# Patient Record
Sex: Female | Born: 1935 | Race: White | Hispanic: No | Smoking: Never smoker
Health system: Southern US, Community
[De-identification: ages and names within clinical notes are randomized; demographics above are authoritative.]

## PROBLEM LIST (undated history)

## (undated) DIAGNOSIS — I429 Cardiomyopathy, unspecified: Secondary | ICD-10-CM

## (undated) DIAGNOSIS — I4891 Unspecified atrial fibrillation: Secondary | ICD-10-CM

## (undated) DIAGNOSIS — I509 Heart failure, unspecified: Secondary | ICD-10-CM

## (undated) DIAGNOSIS — F0390 Unspecified dementia without behavioral disturbance: Secondary | ICD-10-CM

## (undated) HISTORY — DX: Heart failure, unspecified: I50.9

## (undated) HISTORY — DX: Unspecified atrial fibrillation: I48.91

## (undated) HISTORY — DX: Cardiomyopathy, unspecified: I42.9

## (undated) HISTORY — DX: Unspecified dementia, unspecified severity, without behavioral disturbance, psychotic disturbance, mood disturbance, and anxiety: F03.90

---

## 2015-08-16 DIAGNOSIS — H5213 Myopia, bilateral: Secondary | ICD-10-CM | POA: Diagnosis not present

## 2016-05-13 DIAGNOSIS — H02002 Unspecified entropion of right lower eyelid: Secondary | ICD-10-CM | POA: Diagnosis not present

## 2016-05-13 DIAGNOSIS — H02005 Unspecified entropion of left lower eyelid: Secondary | ICD-10-CM | POA: Diagnosis not present

## 2016-05-13 DIAGNOSIS — H01006 Unspecified blepharitis left eye, unspecified eyelid: Secondary | ICD-10-CM | POA: Diagnosis not present

## 2016-05-13 DIAGNOSIS — H2511 Age-related nuclear cataract, right eye: Secondary | ICD-10-CM | POA: Diagnosis not present

## 2016-05-13 DIAGNOSIS — H2512 Age-related nuclear cataract, left eye: Secondary | ICD-10-CM | POA: Diagnosis not present

## 2016-05-13 DIAGNOSIS — H01003 Unspecified blepharitis right eye, unspecified eyelid: Secondary | ICD-10-CM | POA: Diagnosis not present

## 2016-06-19 DIAGNOSIS — J069 Acute upper respiratory infection, unspecified: Secondary | ICD-10-CM | POA: Diagnosis not present

## 2016-07-17 DIAGNOSIS — B078 Other viral warts: Secondary | ICD-10-CM | POA: Diagnosis not present

## 2017-01-13 DIAGNOSIS — L218 Other seborrheic dermatitis: Secondary | ICD-10-CM | POA: Diagnosis not present

## 2017-03-03 DIAGNOSIS — L853 Xerosis cutis: Secondary | ICD-10-CM | POA: Diagnosis not present

## 2017-03-12 DIAGNOSIS — L218 Other seborrheic dermatitis: Secondary | ICD-10-CM | POA: Diagnosis not present

## 2017-06-24 DIAGNOSIS — R490 Dysphonia: Secondary | ICD-10-CM | POA: Diagnosis not present

## 2017-06-26 DIAGNOSIS — H2512 Age-related nuclear cataract, left eye: Secondary | ICD-10-CM | POA: Diagnosis not present

## 2017-06-26 DIAGNOSIS — H35033 Hypertensive retinopathy, bilateral: Secondary | ICD-10-CM | POA: Diagnosis not present

## 2017-06-26 DIAGNOSIS — H524 Presbyopia: Secondary | ICD-10-CM | POA: Diagnosis not present

## 2017-06-26 DIAGNOSIS — H5213 Myopia, bilateral: Secondary | ICD-10-CM | POA: Diagnosis not present

## 2017-06-26 DIAGNOSIS — H2511 Age-related nuclear cataract, right eye: Secondary | ICD-10-CM | POA: Diagnosis not present

## 2017-11-17 DIAGNOSIS — L570 Actinic keratosis: Secondary | ICD-10-CM | POA: Diagnosis not present

## 2017-11-17 DIAGNOSIS — L821 Other seborrheic keratosis: Secondary | ICD-10-CM | POA: Diagnosis not present

## 2017-11-17 DIAGNOSIS — L814 Other melanin hyperpigmentation: Secondary | ICD-10-CM | POA: Diagnosis not present

## 2020-06-08 DIAGNOSIS — R4189 Other symptoms and signs involving cognitive functions and awareness: Secondary | ICD-10-CM | POA: Diagnosis not present

## 2020-07-20 ENCOUNTER — Ambulatory Visit: Payer: Medicare Other | Admitting: Neurology

## 2020-07-20 ENCOUNTER — Encounter: Payer: Self-pay | Admitting: Neurology

## 2020-07-20 VITALS — BP 162/91 | HR 95 | Ht 65.0 in | Wt 101.0 lb

## 2020-07-20 DIAGNOSIS — R413 Other amnesia: Secondary | ICD-10-CM

## 2020-07-20 NOTE — Progress Notes (Signed)
Reason for visit: Dementia  Referring physician: Dr. Hipolito Bayley Kathleen is a 87 y.o. [name]  History of present illness:  Kathleen [name] is an 87 year old right-handed white [name] with a history of a progressive memory disorder.  Kathleen [name] has come in with her daughters today, one of her daughters now lives with her over Kathleen last 7 months.  They have noted a change in her memory over Kathleen last 1 to 2 years.  Kathleen [name] is no longer operating a motor vehicle.  Kathleen [name] requires assistance at this point with managing her medications and appointments.  She needs some help with cooking as well.  Kathleen [name] has developed a short-term memory issue, she has some word finding problems and some difficulty with repeating herself.  She has not had any hallucinations.  She otherwise is very healthy.  She sleeps well at night, she has no numbness or weakness of extremities, she has no balance issues.  She denies issues controlling Kathleen bowels or Kathleen bladder.  She denies any family history of memory issues.  Kathleen [name] has never had surgery and has never really been on any medications for any medical problem.  She is sent to this office for further evaluation.  Her primary care doctor has checked a thyroid profile and a B12 level, Kathleen results of these studies are not available to me.  Past Medical History:  Diagnosis Date  . Dementia (HCC)     History reviewed. No pertinent surgical history.  Family History  Problem Relation Age of Onset  . Stroke Sister   . Leukemia Brother     Social history:  reports that she has never smoked. She has never used smokeless tobacco. She reports that she does not drink alcohol and does not use drugs.  Medications:  Prior to Admission medications   Not on File     No Known Allergies  ROS:  Out of a complete 14 system review of symptoms, Kathleen [name] complains only of Kathleen following symptoms, and all other reviewed systems are negative.  Memory  problems  Blood pressure (!) 162/91, pulse 95, height 5\' 5"  (1.651 m), weight 101 lb (45.8 kg).  Physical Exam  General: Kathleen [name] is alert and cooperative at Kathleen time of Kathleen examination.  Eyes: Pupils are equal, round, and reactive to light. Discs are flat bilaterally.  Neck: Kathleen neck is supple, no carotid bruits are noted.  Respiratory: Kathleen respiratory examination is clear.  Cardiovascular: Kathleen cardiovascular examination reveals a regular rate and rhythm, no obvious murmurs or rubs are noted.  Skin: Extremities are without significant edema.  Neurologic Exam  Mental status: Kathleen [name] is alert and oriented x 3 at Kathleen time of Kathleen examination. Kathleen Mini-Mental status examination done today shows a total score 22/30.  Cranial nerves: Facial symmetry is present. There is good sensation of Kathleen face to pinprick and soft touch bilaterally. Kathleen strength of Kathleen facial muscles and Kathleen muscles to head turning and shoulder shrug are normal bilaterally. Speech is well enunciated, no aphasia or dysarthria is noted. Extraocular movements are full. Visual fields are full. Kathleen tongue is midline, and Kathleen [name] has symmetric elevation of Kathleen soft palate. No obvious hearing deficits are noted.  Motor: Kathleen motor testing reveals 5 over 5 strength of all 4 extremities. Good symmetric motor tone is noted throughout.  Sensory: Sensory testing is intact to pinprick, soft touch, vibration sensation, and position sense on all 4 extremities. No evidence of  extinction is noted.  Coordination: Cerebellar testing reveals good finger-nose-finger and heel-to-shin bilaterally.  Gait and station: Gait is normal. Tandem gait is slightly unsteady. Romberg is negative. No drift is seen.  Reflexes: Deep tendon reflexes are symmetric and normal bilaterally. Toes are downgoing bilaterally.   Assessment/Plan:  1.  Dementia, probable Alzheimer's disease  Kathleen [name] has been unusually healthy throughout her life.   Unfortunately, she has developed some troubles with memory over Kathleen last year or 2.  Her daughter is now living with her and managing her affairs.  We will get a CT scan of Kathleen brain, we discussed Kathleen potential for going on medication such as Aricept or Namenda.  Kathleen daughters wish to discuss this with her before making a decision.  Kathleen [name] will follow up through this office in 6 months.  Marlan Palau MD 07/20/2020 3:56 PM  [facility] [address] [place], [place] [address]  Phone [phone] Fax [phone]

## 2020-07-24 ENCOUNTER — Telehealth: Payer: Self-pay | Admitting: Neurology

## 2020-07-24 NOTE — Telephone Encounter (Signed)
[facility] medicare order sent to [facility]. No auth they will reach out to the patient to schedule.  

## 2020-08-14 ENCOUNTER — Ambulatory Visit: Payer: Medicare Other | Admitting: Neurology

## 2020-08-31 DIAGNOSIS — Z03818 Encounter for observation for suspected exposure to other biological agents ruled out: Secondary | ICD-10-CM | POA: Diagnosis not present

## 2020-08-31 DIAGNOSIS — R0602 Shortness of breath: Secondary | ICD-10-CM | POA: Diagnosis not present

## 2020-09-06 ENCOUNTER — Inpatient Hospital Stay (HOSPITAL_BASED_OUTPATIENT_CLINIC_OR_DEPARTMENT_OTHER)
Admission: EM | Admit: 2020-09-06 | Discharge: 2020-09-11 | DRG: 291 | Disposition: A | Discharge status: Home Health | Payer: Medicare Other | Attending: Internal Medicine | Admitting: Internal Medicine

## 2020-09-06 ENCOUNTER — Encounter (HOSPITAL_BASED_OUTPATIENT_CLINIC_OR_DEPARTMENT_OTHER): Payer: Self-pay | Admitting: *Deleted

## 2020-09-06 ENCOUNTER — Emergency Department (HOSPITAL_BASED_OUTPATIENT_CLINIC_OR_DEPARTMENT_OTHER): Payer: Medicare Other | Admitting: Radiology

## 2020-09-06 ENCOUNTER — Other Ambulatory Visit: Payer: Self-pay

## 2020-09-06 DIAGNOSIS — I10 Essential (primary) hypertension: Secondary | ICD-10-CM

## 2020-09-06 DIAGNOSIS — I5041 Acute combined systolic (congestive) and diastolic (congestive) heart failure: Secondary | ICD-10-CM | POA: Diagnosis present

## 2020-09-06 DIAGNOSIS — R0602 Shortness of breath: Secondary | ICD-10-CM | POA: Diagnosis not present

## 2020-09-06 DIAGNOSIS — I959 Hypotension, unspecified: Secondary | ICD-10-CM | POA: Diagnosis not present

## 2020-09-06 DIAGNOSIS — I509 Heart failure, unspecified: Secondary | ICD-10-CM | POA: Diagnosis not present

## 2020-09-06 DIAGNOSIS — M810 Age-related osteoporosis without current pathological fracture: Secondary | ICD-10-CM | POA: Diagnosis present

## 2020-09-06 DIAGNOSIS — J9601 Acute respiratory failure with hypoxia: Secondary | ICD-10-CM | POA: Diagnosis not present

## 2020-09-06 DIAGNOSIS — H9190 Unspecified hearing loss, unspecified ear: Secondary | ICD-10-CM | POA: Diagnosis present

## 2020-09-06 DIAGNOSIS — J90 Pleural effusion, not elsewhere classified: Secondary | ICD-10-CM | POA: Diagnosis not present

## 2020-09-06 DIAGNOSIS — F0390 Unspecified dementia without behavioral disturbance: Secondary | ICD-10-CM | POA: Diagnosis not present

## 2020-09-06 DIAGNOSIS — Z20822 Contact with and (suspected) exposure to covid-19: Secondary | ICD-10-CM | POA: Diagnosis not present

## 2020-09-06 DIAGNOSIS — R410 Disorientation, unspecified: Secondary | ICD-10-CM | POA: Diagnosis not present

## 2020-09-06 DIAGNOSIS — I429 Cardiomyopathy, unspecified: Secondary | ICD-10-CM | POA: Diagnosis present

## 2020-09-06 DIAGNOSIS — E43 Unspecified severe protein-calorie malnutrition: Secondary | ICD-10-CM | POA: Insufficient documentation

## 2020-09-06 DIAGNOSIS — Z66 Do not resuscitate: Secondary | ICD-10-CM | POA: Diagnosis not present

## 2020-09-06 DIAGNOSIS — I480 Paroxysmal atrial fibrillation: Secondary | ICD-10-CM | POA: Diagnosis present

## 2020-09-06 DIAGNOSIS — R0902 Hypoxemia: Secondary | ICD-10-CM | POA: Diagnosis not present

## 2020-09-06 DIAGNOSIS — I110 Hypertensive heart disease with heart failure: Secondary | ICD-10-CM | POA: Diagnosis not present

## 2020-09-06 DIAGNOSIS — Z681 Body mass index (BMI) 19 or less, adult: Secondary | ICD-10-CM

## 2020-09-06 DIAGNOSIS — I5021 Acute systolic (congestive) heart failure: Secondary | ICD-10-CM | POA: Diagnosis not present

## 2020-09-06 LAB — RESP PANEL BY RT-PCR (FLU A&B, COVID) ARPGX2
Influenza A by PCR: NEGATIVE
Influenza B by PCR: NEGATIVE
SARS Coronavirus 2 by RT PCR: NEGATIVE

## 2020-09-06 LAB — BRAIN NATRIURETIC PEPTIDE: B Natriuretic Peptide: 1461.6 pg/mL — ABNORMAL HIGH (ref 0.0–100.0)

## 2020-09-06 LAB — COMPREHENSIVE METABOLIC PANEL
ALT: 30 U/L (ref 0–44)
AST: 32 U/L (ref 15–41)
Albumin: 3.8 g/dL (ref 3.5–5.0)
Alkaline Phosphatase: 70 U/L (ref 38–126)
Anion gap: 8 (ref 5–15)
BUN: 17 mg/dL (ref 8–23)
CO2: 24 mmol/L (ref 22–32)
Calcium: 9.1 mg/dL (ref 8.9–10.3)
Chloride: 109 mmol/L (ref 98–111)
Creatinine, Ser: 0.92 mg/dL (ref 0.44–1.00)
GFR, Estimated: 60 mL/min — ABNORMAL LOW (ref >60)
Glucose, Bld: 94 mg/dL (ref 70–99)
Potassium: 4.3 mmol/L (ref 3.5–5.1)
Sodium: 141 mmol/L (ref 135–145)
Total Bilirubin: 0.5 mg/dL (ref 0.3–1.2)
Total Protein: 6 g/dL — ABNORMAL LOW (ref 6.5–8.1)

## 2020-09-06 LAB — CBC WITH DIFFERENTIAL/PLATELET
Abs Immature Granulocytes: 0.01 10*3/uL (ref 0.00–0.07)
Basophils Absolute: 0.1 10*3/uL (ref 0.0–0.1)
Basophils Relative: 1 %
Eosinophils Absolute: 0 10*3/uL (ref 0.0–0.5)
Eosinophils Relative: 1 %
HCT: 38.6 % (ref 36.0–46.0)
Hemoglobin: 12.3 g/dL (ref 12.0–15.0)
Immature Granulocytes: 0 %
Lymphocytes Relative: 22 %
Lymphs Abs: 1.2 10*3/uL (ref 0.7–4.0)
MCH: 32.4 pg (ref 26.0–34.0)
MCHC: 31.9 g/dL (ref 30.0–36.0)
MCV: 101.6 fL — ABNORMAL HIGH (ref 80.0–100.0)
Monocytes Absolute: 0.4 10*3/uL (ref 0.1–1.0)
Monocytes Relative: 7 %
Neutro Abs: 3.7 10*3/uL (ref 1.7–7.7)
Neutrophils Relative %: 69 %
Platelets: 160 10*3/uL (ref 150–400)
RBC: 3.8 MIL/uL — ABNORMAL LOW (ref 3.87–5.11)
RDW: 13.9 % (ref 11.5–15.5)
WBC: 5.4 10*3/uL (ref 4.0–10.5)
nRBC: 0 % (ref 0.0–0.2)

## 2020-09-06 LAB — TROPONIN I (HIGH SENSITIVITY)
Troponin I (High Sensitivity): 16 ng/L (ref <18)
Troponin I (High Sensitivity): 18 ng/L — ABNORMAL HIGH (ref <18)

## 2020-09-06 MED ORDER — ENOXAPARIN SODIUM 40 MG/0.4ML IJ SOSY
40.0000 mg | PREFILLED_SYRINGE | INTRAMUSCULAR | Status: DC
  Administered 2020-09-06: 40 mg via SUBCUTANEOUS
  Filled 2020-09-06: qty 0.4

## 2020-09-06 MED ORDER — FUROSEMIDE 10 MG/ML IJ SOLN
20.0000 mg | Freq: Once | INTRAMUSCULAR | Status: AC
  Administered 2020-09-06: 20 mg via INTRAVENOUS
  Filled 2020-09-06: qty 2

## 2020-09-06 NOTE — ED Triage Notes (Signed)
Shortness of breath x 6 days, patient stated that she is better today.  Denies fever

## 2020-09-06 NOTE — ED Provider Notes (Signed)
[facility] EMERGENCY DEPT Provider Note   CSN: [id number] Arrival date & time: 09/06/20  1251     History Chief Complaint  Patient presents with  . Shortness of Breath    Kathleen [name] is a 87 y.o. female.  87 year old female with history of dementia who presents with shortness of breath.  Patient has had 1 week of progressively worsening shortness of breath especially with exertion.  Daughter notes that for the past several nights she has not been able to lay flat and seems to get very dyspneic when she tries to do so.  Patient denies any complaints including no chest pain or abdominal pain.  She has had no cough, congestion, fevers, or other URI symptoms.  No leg swelling.  Daughter states patient has no cardiac history.  She does not take any medications.  LEVEL 5 CAVEAT DUE TO DEMENTIA  The history is provided by the patient and a relative.  Shortness of Breath      Past Medical History:  Diagnosis Date  . Dementia (HCC)     There are no problems to display for this patient.   History reviewed. No pertinent surgical history.   OB History    Gravida  2   Para  2   Term      Preterm      AB      Living        SAB      IAB      Ectopic      Multiple      Live Births              Family History  Problem Relation Age of Onset  . Stroke Sister   . Leukemia Brother     Social History   Tobacco Use  . Smoking status: Never Smoker  . Smokeless tobacco: Never Used  Substance Use Topics  . Alcohol use: Never  . Drug use: Never    Home Medications Prior to Admission medications   Not on File    Allergies    Patient has no known allergies.  Review of Systems   Review of Systems  Unable to perform ROS: Dementia  Respiratory: Positive for shortness of breath.     Physical Exam Updated Vital Signs BP (!) 142/92 Comment: 4L [place]  Pulse (!) 32 Comment: 4L [place]  Temp 98.5 F (36.9 C) (Oral)   Resp (!) 22 Comment: 4L [place]   Ht 5\' 4"  (1.626 m)   Wt 47.4 kg   SpO2 94% Comment: 4L [place]  BMI 17.92 kg/m   Physical Exam Vitals and nursing note reviewed.  Constitutional:      General: She is not in acute distress.    Appearance: Normal appearance.  HENT:     Head: Normocephalic and atraumatic.  Eyes:     Conjunctiva/sclera: Conjunctivae normal.  Cardiovascular:     Rate and Rhythm: Normal rate and regular rhythm.     Heart sounds: Murmur heard.    Pulmonary:     Comments: Tachypnea without respiratory distress, diminished bilaterally with crackles in bilateral bases Abdominal:     General: Abdomen is flat. Bowel sounds are normal. There is no distension.     Palpations: Abdomen is soft.     Tenderness: There is no abdominal tenderness.  Musculoskeletal:     Right lower leg: No edema.     Left lower leg: No edema.  Skin:    General: Skin is warm and dry.  Neurological:     Mental Status: She is alert.     Comments: Alert, following commands  Psychiatric:        Mood and Affect: Mood normal.        Behavior: Behavior normal.        Cognition and Memory: Memory is impaired.     ED Results / Procedures / Treatments   Labs (all labs ordered are listed, but only abnormal results are displayed) Labs Reviewed  COMPREHENSIVE METABOLIC PANEL - Abnormal; Notable for the following components:      Result Value   Total Protein 6.0 (*)    GFR, Estimated 60 (*)    All other components within normal limits  CBC WITH DIFFERENTIAL/PLATELET - Abnormal; Notable for the following components:   RBC 3.80 (*)    MCV 101.6 (*)    All other components within normal limits  BRAIN NATRIURETIC PEPTIDE - Abnormal; Notable for the following components:   B Natriuretic Peptide 1,461.6 (*)    All other components within normal limits  RESP PANEL BY RT-PCR (FLU A&B, COVID) [id number]  TROPONIN I (HIGH SENSITIVITY)  TROPONIN I (HIGH SENSITIVITY)    EKG EKG Interpretation  Date/Time:  Wednesday September 06 2020 12:58:44  EDT Ventricular Rate:  94 PR Interval:  123 QRS Duration: 84 QT Interval:  355 QTC Calculation: 444 R Axis:   56 Text Interpretation: Sinus tachycardia Paired ventricular premature complexes Nonspecific T abnormalities, lateral leads No previous ECGs available Confirmed by Frederick Peers [phone]) on 09/06/2020 1:01:24 PM   Radiology DG Chest 2 View  Result Date: 09/06/2020 CLINICAL DATA:  Shortness of breath EXAM: CHEST - 2 VIEW COMPARISON:  None. FINDINGS: There are pleural effusions bilaterally with patchy airspace opacity in the lower lung regions bilaterally. Heart size and pulmonary vascularity are normal. No adenopathy. There is aortic atherosclerosis. There is severe anterior wedging of the T12 vertebral body. There is an old healed fracture of the proximal left humerus. Bones are osteoporotic. IMPRESSION: Pleural effusions bilaterally. Suspect bibasilar pneumonia superimposed. Heart size and pulmonary vascularity are normal. Osteoporosis with prior fractures of the proximal left humerus and T12 vertebral body. Aortic Atherosclerosis (ICD10-I70.0). Electronically Signed   By: Bretta Bang III M.D.   On: 09/06/2020 14:12    Procedures Procedures   Medications Ordered in ED Medications  furosemide (LASIX) injection 20 mg (20 mg Intravenous Given 09/06/20 1444)    ED Course  I have reviewed the triage vital signs and the nursing notes.  Pertinent labs & imaging results that were available during my care of the patient were reviewed by me and considered in my medical decision making (see chart for details).    MDM Rules/Calculators/A&P                          Patient was nontoxic on exam, 89% on room air and required 2 L oxygen to maintain saturations.  Crackles in murmur noted.  Suspect congestive heart failure based on slow progression of symptoms and no URI symptoms to suggest a pneumonia.  Lab work notable for BNP 1461, negative troponin.  Negative COVID.  X-ray with bilateral  pleural effusions.  Patient has no risk factors for PE.  Gave 20 mg IV Lasix and recommended admission for diuresis and heart failure work-up, daughter in agreement.  Discussed admission with [facility] hospitalist, Dr. Denton Lank.  Final Clinical Impression(s) / ED Diagnoses Final diagnoses:  Acute congestive heart failure, unspecified heart failure type (  HCC)  Acute respiratory failure with hypoxia [facility])    Rx / DC Orders ED Discharge Orders    None       Dominyk Law, Ambrose Finland, MD 09/06/20 1502

## 2020-09-06 NOTE — ED Notes (Signed)
Daughter has gone to get pt food.

## 2020-09-06 NOTE — ED Notes (Signed)
Patient has a bed at [facility] [id number]--[facility] to transport patient

## 2020-09-06 NOTE — ED Notes (Signed)
[facility] at bedside 

## 2020-09-07 ENCOUNTER — Inpatient Hospital Stay (HOSPITAL_COMMUNITY): Payer: Medicare Other

## 2020-09-07 DIAGNOSIS — I509 Heart failure, unspecified: Secondary | ICD-10-CM

## 2020-09-07 DIAGNOSIS — I10 Essential (primary) hypertension: Secondary | ICD-10-CM

## 2020-09-07 DIAGNOSIS — J9601 Acute respiratory failure with hypoxia: Secondary | ICD-10-CM

## 2020-09-07 LAB — ECHOCARDIOGRAM COMPLETE
Area-P 1/2: 4.15 cm2
Calc EF: 30.8 %
Height: 64 in
S' Lateral: 3.8 cm
Single Plane A2C EF: 20.4 %
Single Plane A4C EF: 39 %
Weight: 1670.4 oz

## 2020-09-07 LAB — BASIC METABOLIC PANEL
Anion gap: 7 (ref 5–15)
BUN: 16 mg/dL (ref 8–23)
CO2: 28 mmol/L (ref 22–32)
Calcium: 8.8 mg/dL — ABNORMAL LOW (ref 8.9–10.3)
Chloride: 108 mmol/L (ref 98–111)
Creatinine, Ser: 0.95 mg/dL (ref 0.44–1.00)
GFR, Estimated: 58 mL/min — ABNORMAL LOW (ref >60)
Glucose, Bld: 92 mg/dL (ref 70–99)
Potassium: 3.8 mmol/L (ref 3.5–5.1)
Sodium: 143 mmol/L (ref 135–145)

## 2020-09-07 MED ORDER — ONDANSETRON HCL 4 MG PO TABS: 4.0000 mg | ORAL_TABLET | Freq: Four times a day (QID) | ORAL | Status: DC | PRN

## 2020-09-07 MED ORDER — FUROSEMIDE 10 MG/ML IJ SOLN: 20.0000 mg | Freq: Every day | INTRAMUSCULAR | Status: DC

## 2020-09-07 MED ORDER — ACETAMINOPHEN 650 MG RE SUPP: 650.0000 mg | Freq: Four times a day (QID) | RECTAL | Status: DC | PRN

## 2020-09-07 MED ORDER — LISINOPRIL 5 MG PO TABS: 5.0000 mg | ORAL_TABLET | Freq: Every day | ORAL | Status: DC

## 2020-09-07 MED ORDER — POTASSIUM CHLORIDE CRYS ER 20 MEQ PO TBCR
40.0000 meq | EXTENDED_RELEASE_TABLET | Freq: Once | ORAL | Status: AC
  Administered 2020-09-07: 40 meq via ORAL
  Filled 2020-09-07: qty 2

## 2020-09-07 MED ORDER — DILTIAZEM HCL 25 MG/5ML IV SOLN
10.0000 mg | Freq: Once | INTRAVENOUS | Status: AC
  Administered 2020-09-07: 10 mg via INTRAVENOUS
  Filled 2020-09-07: qty 5

## 2020-09-07 MED ORDER — ONDANSETRON HCL 4 MG/2ML IJ SOLN: 4.0000 mg | Freq: Four times a day (QID) | INTRAMUSCULAR | Status: DC | PRN

## 2020-09-07 MED ORDER — ENOXAPARIN SODIUM 60 MG/0.6ML IJ SOSY
50.0000 mg | PREFILLED_SYRINGE | Freq: Every day | INTRAMUSCULAR | Status: DC
  Administered 2020-09-07 – 2020-09-08 (×2): 50 mg via SUBCUTANEOUS
  Filled 2020-09-07 (×2): qty 0.6

## 2020-09-07 MED ORDER — SENNOSIDES-DOCUSATE SODIUM 8.6-50 MG PO TABS: 1.0000 | ORAL_TABLET | Freq: Every evening | ORAL | Status: DC | PRN

## 2020-09-07 MED ORDER — FUROSEMIDE 10 MG/ML IJ SOLN
40.0000 mg | Freq: Every day | INTRAMUSCULAR | Status: DC
  Administered 2020-09-07: 40 mg via INTRAVENOUS
  Filled 2020-09-07 (×2): qty 4

## 2020-09-07 MED ORDER — DILTIAZEM HCL-DEXTROSE 125-5 MG/125ML-% IV SOLN (PREMIX)
5.0000 mg/h | INTRAVENOUS | Status: DC
  Administered 2020-09-07: 5 mg/h via INTRAVENOUS
  Filled 2020-09-07: qty 125

## 2020-09-07 MED ORDER — ACETAMINOPHEN 325 MG PO TABS: 650.0000 mg | ORAL_TABLET | Freq: Four times a day (QID) | ORAL | Status: DC | PRN

## 2020-09-07 NOTE — Progress Notes (Signed)
   09/07/20 1048  Assess: MEWS Score  Temp 97.8 F (36.6 C)  BP (!) 105/55  ECG Heart Rate (!) 135  Resp 18  Level of Consciousness Alert  SpO2 95 %  O2 Device Nasal Cannula  O2 Flow Rate (L/min) 4 L/min  Assess: MEWS Score  MEWS Temp 0  MEWS Systolic 0  MEWS Pulse 3  MEWS RR 0  MEWS LOC 0  MEWS Score 3  MEWS Score Color Yellow  Assess: if the MEWS score is Yellow or Red  Were vital signs taken at a resting state? Yes  Focused Assessment No change from prior assessment  Does the patient meet 2 or more of the SIRS criteria? No  MEWS guidelines implemented *See Row Information* Yes  Treat  MEWS Interventions Administered scheduled meds/treatments  Notify: Charge Nurse/RN  Name of Charge Nurse/RN Notified Megan, RN  Date Charge Nurse/RN Notified 09/07/20  Time Charge Nurse/RN Notified 1103  Notify: Provider  Provider Name/Title Leodis Binet MD  Date Provider Notified 09/07/20  Time Provider Notified 1010  Notification Type Page  Notification Reason Change in status  Provider response See new orders  Date of Provider Response 09/07/20  Time of Provider Response 1020  Assess: SIRS CRITERIA  SIRS Temperature  0  SIRS Pulse 1  SIRS Respirations  0  SIRS WBC 0  SIRS Score Sum  1

## 2020-09-07 NOTE — Progress Notes (Signed)
  Echocardiogram 2D Echocardiogram has been performed.  Kathleen [name] 09/07/2020, 3:44 PM

## 2020-09-07 NOTE — Plan of Care (Signed)
?  Problem: Education: ?Goal: Ability to demonstrate management of disease process will improve ?Outcome: Progressing ?  ?Problem: Activity: ?Goal: Capacity to carry out activities will improve ?Outcome: Progressing ?  ?Problem: Cardiac: ?Goal: Ability to achieve and maintain adequate cardiopulmonary perfusion will improve ?Outcome: Progressing ?  ?

## 2020-09-07 NOTE — Progress Notes (Signed)
[facility] Progress Note  Patient: Kathleen [name]    DDU:[id number]  DOA: 09/06/2020     Date of Service: the patient was seen and examined on 09/07/2020  Brief hospital course: Past medical history of mild cognitive imbalance.  Presents with complaints of shortness of breath, orthopnea.  Work-up so far consistent with acute CHF.  Found to have A. fib with RVR during the hospital stay. Currently plan is rate control and treatment of CHF.  Assessment and Plan: 1.  Acute unspecified CHF, likely diastolic Likely combination of uncontrolled hypertension as well as A. fib with RVR. Echocardiogram completed currently results pending Given IV Lasix. Clinical examination appears to be euvolemic therefore we will discontinue IV Lasix and transition to p.o. tomorrow. Further work-up and treatment based on the results of the echocardiogram. Less likely ACS given negative troponin as well as no anginal symptoms.  2.  Acute hypoxic respiratory failure, POA Currently able to come off of the oxygen. Monitor.  3.  Paroxysmal A. fib with RVR Patient was given 1 dose of IV Cardizem without any improvement. Currently on Cardizem drip. For therapeutic anticoagulation discussed with patient and daughter in detail.  Patient does not have any active recurrent fall or bleeding history. [place] Vascor is 5. Family currently agreeable for therapy anticoagulation.  Will initiate with Lovenox and discuss DOAC's once echocardiogram is available.  4.  Essential hypertension Currently on Cardizem.  Holding lisinopril.  5.  Underweight Placing the patient at high risk of poor outcome. Monitor. Body mass index is 17.92 kg/m.   Diet: Cardiac diet DVT Prophylaxis: Subcutaneous Lovenox      Advance goals of care discussion: Full code  Family Communication: family was present at bedside, at the time of interview.  The pt provided permission to discuss medical plan with the family. Opportunity was given to  ask question and all questions were answered satisfactorily.   Disposition:  Status is: Inpatient  Remains inpatient appropriate because:Ongoing diagnostic testing needed not appropriate for outpatient work up   Dispo: The patient is from: Home              Anticipated d/c is to: Home              Patient currently is not medically stable to d/c.   Difficult to place patient No        Subjective: No nausea or vomiting.  No fever no chills.  Continues to have shortness of breath although feeling better.  No chest pain or chest tightness.  Physical Exam:  General: Appear in mild distress, no Rash; Oral Mucosa Clear, moist. no Abnormal Neck Mass Or lumps, Conjunctiva normal  Cardiovascular: S1 and S2 Present, no Murmur, Respiratory: good respiratory effort, Bilateral Air entry present and CTA, no Crackles, no wheezes Abdomen: Bowel Sound present, Soft and no tenderness Extremities: no Pedal edema Neurology: alert and oriented to time, place, and person affect appropriate. no new focal deficit Gait not checked due to patient safety concerns    Vitals:   09/07/20 0935 09/07/20 1004 09/07/20 1048 09/07/20 1330  BP: (!) 142/83 125/85 (!) 105/55 91/66  Pulse:    94  Resp: 20  18 18   Temp: 97.8 F (36.6 C)  97.8 F (36.6 C) (!) 97 F (36.1 C)  TempSrc: Axillary  Axillary Oral  SpO2: 95%  95%   Weight:      Height:        Intake/Output Summary (Last 24 hours) at 09/07/2020 1605 Last data  filed at 09/07/2020 1541 Gross per 24 hour  Intake 256.17 ml  Output 1550 ml  Net -1293.83 ml   Filed Weights   09/06/20 1259  Weight: 47.4 kg    Data Reviewed: I have personally reviewed and interpreted daily labs, tele strips, imaging. I reviewed all nursing notes, pharmacy notes, vitals, pertinent old records I have discussed plan of care as described above with RN and patient/family.  CBC: Recent Labs  Lab 09/06/20 1326  WBC 5.4  NEUTROABS 3.7  HGB 12.3  HCT 38.6  MCV  101.6*  PLT 160   Basic Metabolic Panel: Recent Labs  Lab 09/06/20 1326 09/07/20 0330  NA 141 143  K 4.3 3.8  CL 109 108  CO2 24 28  GLUCOSE 94 92  BUN 17 16  CREATININE 0.92 0.95  CALCIUM 9.1 8.8*    Studies: No results found.  Scheduled Meds: . enoxaparin (LOVENOX) injection  50 mg Subcutaneous Daily   Continuous Infusions: . diltiazem (CARDIZEM) infusion 5 mg/hr (09/07/20 1223)   PRN Meds: acetaminophen **OR** acetaminophen, ondansetron **OR** ondansetron (ZOFRAN) IV, senna-docusate  Time spent: 35 minutes  Author: Lynden Oxford, MD [facility] 09/07/2020 4:05 PM  To reach On-call, see care teams to locate the attending and reach out via [email or web address]. Between 7PM-7AM, please contact night-coverage If you still have difficulty reaching the attending provider, please page the [facility] (Director on Call) for [facility] on amion for assistance.

## 2020-09-07 NOTE — Plan of Care (Signed)
?  Problem: Education: ?Goal: Ability to demonstrate management of disease process will improve ?Outcome: Progressing ?  ?

## 2020-09-07 NOTE — H&P (Addendum)
History and Physical    [name]:[id number] DOB: 22-Jul-1929 DOA: 09/06/2020  PCP: Jarrett Soho, PA-C  Patient coming from: [facility]  I have personally briefly reviewed patient's old medical records in [facility]  Chief Complaint: acute hypoxia  HPI: Kathleen [name] is a 87 y.o. female with medical history significant for cognitive impairment, hearing impairment who presents with concerns of new onset CHF exacerbation.   Daughter at bedside provides most of history since patient unable to recall her symptoms.  About a week ago patient began to complain of increasing shortness of breath with laying flat.  The symptoms would improve with sleeping with more pillows and she eventually had to sleep on the recliner.  Denies any chest pain or palpitation symptoms. No fever or cough.  No lower extremity edema.  Denies any tobacco, alcohol or illicit drug use. Daughter has tried to take blood pressure at home but does not think it has been actual since it fluctuates from systolic of 160 to 130s at times.   ED Course: She was noted to be mildly hypertensive with systolic in the 160s requiring 2 L of O2, BNP of 1461, troponin of 16 and then 18.  EKG shows frequent PVCs but no ST or T wave abnormalities.  Chest x-ray shows bilateral pleural effusion.  CBC and CMP unremarkable.  She received 20 mg of IV Lasix in the ED with over 1 L of urine output.  Review of Systems:  Constitutional: No Weight Change, No Fever ENT/Mouth: No sore throat, No Rhinorrhea Eyes: No Eye Pain, No Vision Changes Cardiovascular: No Chest Pain, + SOB, No PND, + Dyspnea on Exertion, + Orthopnea, No Claudication, No Edema, No Palpitations Respiratory: No Cough, No Sputum, No Wheezing, no Dyspnea  Gastrointestinal: No Nausea, No Vomiting, No Diarrhea,Genitourinary: no Urinary Incontinence, No Urgency, No Flank Pain Musculoskeletal: No Arthralgias, No Myalgias Skin: No Skin Lesions, No Pruritus, Neuro: no Weakness,  No Numbness Psych: No Anxiety/Panic, No Depression, no decrease appetite Heme/Lymph: No Bruising, No Bleeding   Past Medical History:  Diagnosis Date  . Dementia (HCC)     History reviewed. No pertinent surgical history.   reports that she has never smoked. She has never used smokeless tobacco. She reports that she does not drink alcohol and does not use drugs. Social History  No Known Allergies  Family History  Problem Relation Age of Onset  . Stroke Sister   . Leukemia Brother      Prior to Admission medications   Not on File    Physical Exam: Vitals:   09/06/20 1830 09/06/20 1946 09/06/20 2030 09/06/20 2200  BP: 133/78 (!) 142/82 (!) 144/68 (!) 160/78  Pulse: 87 86 (!) 45 84  Resp: (!) 28 (!) 27 (!) 26 (!) 29  Temp:    98.5 F (36.9 C)  TempSrc:    Oral  SpO2: 95% 97% 97% 93%  Weight:      Height:        Constitutional: NAD, calm, comfortable,thin elderly female laying at 30 degree incline in bed. Wears hearing aid.  Vitals:   09/06/20 1830 09/06/20 1946 09/06/20 2030 09/06/20 2200  BP: 133/78 (!) 142/82 (!) 144/68 (!) 160/78  Pulse: 87 86 (!) 45 84  Resp: (!) 28 (!) 27 (!) 26 (!) 29  Temp:    98.5 F (36.9 C)  TempSrc:    Oral  SpO2: 95% 97% 97% 93%  Weight:      Height:  Eyes: PERRL, lids and conjunctivae normal ENMT: Mucous membranes are moist.  Neck: normal, supple Respiratory: clear to auscultation bilaterally, no wheezing, no crackles. Normal respiratory effort. No accessory muscle use.  Cardiovascular: Regular rate and rhythm, no murmurs / rubs / gallops. No extremity edema.   Abdomen: no tenderness, no masses palpated.  Bowel sounds positive.  Musculoskeletal: no clubbing / cyanosis. No joint deformity upper and lower extremities. Good ROM, no contractures. Normal muscle tone.  Skin: no rashes, lesions, ulcers. No induration Neurologic: CN 2-12 grossly intact. Sensation intact,  Strength 5/5 in all 4.  Psychiatric: Normal judgment and  insight. Alert and oriented x 3 but unable to recall symptoms. Normal and pleasant mood.    Labs on Admission: I have personally reviewed following labs and imaging studies  CBC: Recent Labs  Lab 09/06/20 1326  WBC 5.4  NEUTROABS 3.7  HGB 12.3  HCT 38.6  MCV 101.6*  PLT 160   Basic Metabolic Panel: Recent Labs  Lab 09/06/20 1326  NA 141  K 4.3  CL 109  CO2 24  GLUCOSE 94  BUN 17  CREATININE 0.92  CALCIUM 9.1   GFR: Estimated Creatinine Clearance: 31.6 mL/min (by C-G formula based on SCr of 0.92 mg/dL). Liver Function Tests: Recent Labs  Lab 09/06/20 1326  AST 32  ALT 30  ALKPHOS 70  BILITOT 0.5  PROT 6.0*  ALBUMIN 3.8   No results for input(s): LIPASE, AMYLASE in the last 168 hours. No results for input(s): AMMONIA in the last 168 hours. Coagulation Profile: No results for input(s): INR, PROTIME in the last 168 hours. Cardiac Enzymes: No results for input(s): CKTOTAL, CKMB, CKMBINDEX, TROPONINI in the last 168 hours. BNP (last 3 results) No results for input(s): PROBNP in the last 8760 hours. HbA1C: No results for input(s): HGBA1C in the last 72 hours. CBG: No results for input(s): GLUCAP in the last 168 hours. Lipid Profile: No results for input(s): CHOL, HDL, LDLCALC, TRIG, CHOLHDL, LDLDIRECT in the last 72 hours. Thyroid Function Tests: No results for input(s): TSH, T4TOTAL, FREET4, T3FREE, THYROIDAB in the last 72 hours. Anemia Panel: No results for input(s): VITAMINB12, FOLATE, FERRITIN, TIBC, IRON, RETICCTPCT in the last 72 hours. Urine analysis: No results found for: COLORURINE, APPEARANCEUR, LABSPEC, PHURINE, GLUCOSEU, HGBUR, BILIRUBINUR, KETONESUR, PROTEINUR, UROBILINOGEN, NITRITE, LEUKOCYTESUR  Radiological Exams on Admission: DG Chest 2 View  Result Date: 09/06/2020 CLINICAL DATA:  Shortness of breath EXAM: CHEST - 2 VIEW COMPARISON:  None. FINDINGS: There are pleural effusions bilaterally with patchy airspace opacity in the lower lung  regions bilaterally. Heart size and pulmonary vascularity are normal. No adenopathy. There is aortic atherosclerosis. There is severe anterior wedging of the T12 vertebral body. There is an old healed fracture of the proximal left humerus. Bones are osteoporotic. IMPRESSION: Pleural effusions bilaterally. Suspect bibasilar pneumonia superimposed. Heart size and pulmonary vascularity are normal. Osteoporosis with prior fractures of the proximal left humerus and T12 vertebral body. Aortic Atherosclerosis (ICD10-I70.0). Electronically Signed   By: Bretta Bang III M.D.   On: 09/06/2020 14:12      Assessment/Plan  New onset CHF exacerbation -possibly due to uncontrolled HTN  -Obtain echo -IV lasix 20mg  daily -strict intake and output -daily weights -start Lisinopril 5mg  daily  Acute hypoxic respiratory failure secondary to CHF exacerbation -Admitted on 2L, Maintain O2 >92%-wean as tolerated -treatment as above  HTN -start ACE as above    DVT prophylaxis:.Lovenox Code Status: Full Family Communication: Plan discussed with patient and daughter at bedside  disposition Plan: Home with at least 2 midnight stays  Consults called:  Admission status: inpatient  Level of care: Telemetry  Status is: Inpatient  Remains inpatient appropriate because:Inpatient level of care appropriate due to severity of illness   Dispo: The patient is from: Home              Anticipated d/c is to: Home              Patient currently is not medically stable to d/c.   Difficult to place patient No         Anselm Jungling DO [facility]   If 7PM-7AM, please contact night-coverage [email or web address]   09/07/2020, 12:28 AM

## 2020-09-08 LAB — BASIC METABOLIC PANEL
Anion gap: 6 (ref 5–15)
BUN: 23 mg/dL (ref 8–23)
CO2: 30 mmol/L (ref 22–32)
Calcium: 9.5 mg/dL (ref 8.9–10.3)
Chloride: 106 mmol/L (ref 98–111)
Creatinine, Ser: 0.96 mg/dL (ref 0.44–1.00)
GFR, Estimated: 57 mL/min — ABNORMAL LOW (ref >60)
Glucose, Bld: 128 mg/dL — ABNORMAL HIGH (ref 70–99)
Potassium: 4.1 mmol/L (ref 3.5–5.1)
Sodium: 142 mmol/L (ref 135–145)

## 2020-09-08 LAB — MAGNESIUM: Magnesium: 2 mg/dL (ref 1.7–2.4)

## 2020-09-08 MED ORDER — METOPROLOL TARTRATE 25 MG PO TABS
25.0000 mg | ORAL_TABLET | Freq: Two times a day (BID) | ORAL | Status: DC
  Administered 2020-09-08 – 2020-09-09 (×4): 25 mg via ORAL
  Filled 2020-09-08 (×4): qty 1

## 2020-09-08 MED ORDER — METOPROLOL TARTRATE 5 MG/5ML IV SOLN: 2.5000 mg | INTRAVENOUS | Status: DC | PRN

## 2020-09-08 MED ORDER — APIXABAN 2.5 MG PO TABS
2.5000 mg | ORAL_TABLET | Freq: Two times a day (BID) | ORAL | Status: DC
  Administered 2020-09-09 – 2020-09-11 (×5): 2.5 mg via ORAL
  Filled 2020-09-08 (×5): qty 1

## 2020-09-08 MED ORDER — SACUBITRIL-VALSARTAN 24-26 MG PO TABS
1.0000 | ORAL_TABLET | Freq: Two times a day (BID) | ORAL | Status: DC
  Administered 2020-09-08 (×2): 1 via ORAL
  Filled 2020-09-08 (×3): qty 1

## 2020-09-08 MED ORDER — FUROSEMIDE 20 MG PO TABS
20.0000 mg | ORAL_TABLET | Freq: Every day | ORAL | Status: DC
  Administered 2020-09-08: 20 mg via ORAL
  Filled 2020-09-08: qty 1

## 2020-09-08 NOTE — Care Management Important Message (Signed)
Important Message  Patient Details IM Letter given to the Patient. Name: Veora Fonte MRN: [id number] Date of Birth: Feb 21, 1930   Medicare Important Message Given:  Yes     Caren Macadam 09/08/2020, 10:32 AM

## 2020-09-08 NOTE — Plan of Care (Signed)
?  Problem: Education: ?Goal: Ability to demonstrate management of disease process will improve ?Outcome: Progressing ?  ?Problem: Activity: ?Goal: Capacity to carry out activities will improve ?Outcome: Progressing ?  ?Problem: Cardiac: ?Goal: Ability to achieve and maintain adequate cardiopulmonary perfusion will improve ?Outcome: Progressing ?  ?

## 2020-09-08 NOTE — Evaluation (Signed)
Physical Therapy Evaluation Only Patient Details Name: Kathleen [name] MRN: [id number] DOB: 03-03-1930 Today's Date: 09/08/2020   History of Present Illness  Kathleen [name] is an 87 yo female who presents with SOB. Chest xray: bil pleural effusions. PMH: dementia  Clinical Impression  Pt completes bed mobility, transfers and ambulation without DME, no VCs and no physical assist. Pt ambulates 360 ft in hallway on RA, SpO2 varying 88-92%, cued for pursed lip breathing when desats with good recovery. Pt without LOB with sudden stop, direction changes, speed changes and turns. Once returned to room, returned 2L O2 and RN notified of SpO2 with mobility. Pt denies chest pain, SOB, pain with ambulation. Pt with daughter providing supv at baseline due to cognition, good family support. No acute PT needs identified, will sign off at this time.    Follow Up Recommendations No PT follow up    Equipment Recommendations  None recommended by PT    Recommendations for Other Services       Precautions / Restrictions Precautions Precautions: Other (comment) Precaution Comments: monitor O2 Restrictions Weight Bearing Restrictions: No      Mobility  Bed Mobility Overal bed mobility: Modified Independent   Transfers Overall transfer level: Independent Equipment used: None    General transfer comment: powers to stand with good steadiness, no AD  Ambulation/Gait Ambulation/Gait assistance: Independent Gait Distance (Feet): 360 Feet Assistive device: None Gait Pattern/deviations: WFL(Within Functional Limits) Gait velocity: WFL   General Gait Details: pt ambulates without AD, completes turns, speed changes, direction changes, sudden stop, head turns without LOB  Stairs            Wheelchair Mobility    Modified Rankin (Stroke Patients Only)       Balance Overall balance assessment: No apparent balance deficits (not formally assessed)        Pertinent Vitals/Pain Pain Assessment:  No/denies pain    Home Living Family/patient expects to be discharged to:: Private residence Living Arrangements: Children Available Help at Discharge: Family;Available 24 hours/day Type of Home: House Home Access: Level entry     Home Layout: One level Home Equipment: Walker - 2 wheels;Cane - single point      Prior Function Level of Independence: Needs assistance   Gait / Transfers Assistance Needed: daughter reports pt requires supv for safety, no physical assistance, denies falls, walks on road for exercise and used to participate with exercise videos  ADL's / Homemaking Assistance Needed: daugther reports pt requires supv for safety, no physical assistance  Comments: daughter provides transportation     Hand Dominance        Extremity/Trunk Assessment   Upper Extremity Assessment Upper Extremity Assessment: Overall WFL for tasks assessed    Lower Extremity Assessment Lower Extremity Assessment: Overall WFL for tasks assessed (AROM WNL, strength 4+/5 throughout, denies numbness/tingling)    Cervical / Trunk Assessment Cervical / Trunk Assessment: Normal  Communication   Communication: No difficulties  Cognition Arousal/Alertness: Awake/alert Behavior During Therapy: WFL for tasks assessed/performed Overall Cognitive Status: History of cognitive impairments - at baseline  General Comments: Pt pleasant, follows commands appropriately, answers questions appropriately      General Comments General comments (skin integrity, edema, etc.): Pt on RA with SpO2 88-92% on RA, no dyspnea    Exercises     Assessment/Plan    PT Assessment Patent does not need any further PT services  PT Problem List         PT Treatment Interventions      PT  Goals (Current goals can be found in the Care Plan section)  Acute Rehab PT Goals Patient Stated Goal: back to exercising PT Goal Formulation: All assessment and education complete, DC therapy    Frequency     Barriers  to discharge        Co-evaluation               AM-PAC PT "6 Clicks" Mobility  Outcome Measure Help needed turning from your back to your side while in a flat bed without using bedrails?: None Help needed moving from lying on your back to sitting on the side of a flat bed without using bedrails?: None Help needed moving to and from a bed to a chair (including a wheelchair)?: None Help needed standing up from a chair using your arms (e.g., wheelchair or bedside chair)?: None Help needed to walk in hospital room?: None Help needed climbing 3-5 steps with a railing? : None 6 Click Score: 24    End of Session   Activity Tolerance: Patient tolerated treatment well Patient left: in chair;with call bell/phone within reach;with family/visitor present Nurse Communication: Mobility status;Other (comment) (SpO2) PT Visit Diagnosis: Other abnormalities of gait and mobility (R26.89)    Time: 6160-7371 PT Time Calculation (min) (ACUTE ONLY): 30 min   Charges:   PT Evaluation $PT Eval Low Complexity: 1 Low PT Treatments $Gait Training: 8-22 mins         Tori Sheryll Dymek PT, DPT 09/08/20, 11:48 AM

## 2020-09-08 NOTE — Discharge Instructions (Signed)

## 2020-09-08 NOTE — TOC Progression Note (Signed)
Transition of Care [facility]) - Progression Note    Patient Details  Name: Kathleen [name] MRN: [id number] Date of Birth: [birth year]  [facility] CM/SW Contact  Darleene Cleaver, [place] Phone Number: 09/08/2020, 5:48 PM  Clinical Narrative:     Patient is from home with her daughter.  CSW spoke to patient's daughter due to history of dementia.  Patient has not had home health before, CSW explained how insurance will pay for home health.  Patient's daughter stated she would like HH with PT, RN.  Csw.  CSW asked if daughter had any preference she did not.    CSW spoke to [place] [facility] they can accept [facility] services.  CSW asked if they needed any equipment, and per daughter April, they do not.  CSW to continue to follow patient's progress throughout discharge planning.                                                       Expected Discharge Plan: Home w Home Health Services Barriers to Discharge: Continued Medical Work up  Expected Discharge Plan and Services Expected Discharge Plan: Home w Home Health Services In-house Referral: Clinical Social [facility] Living arrangements for the past 2 months: Single Family Home                           HH Arranged: PT,Nurse's Aide,RN,Social Work [employer] Agency: [facility] ([facility]) Date HH Agency Contacted: 09/08/20 Time HH Agency Contacted: 1630 Representative spoke with at [facility] Agency: Thea Silversmith   Social Determinants of Health (SDOH) Interventions    Readmission Risk Interventions No flowsheet data found.

## 2020-09-08 NOTE — Consult Note (Addendum)
Cardiology Consultation:   Patient ID: Kathleen [name] MRN: [id number]; DOB: 1930-01-11  Admit date: 09/06/2020 Date of Consult: 09/08/2020  PCP:  Jarrett Soho, PA-C   [facility] Providers Cardiologist:  New to Dr. Antoine Poche   Patient Profile:   Kathleen [name] is a 87 y.o. female with a hx of dementia, who is being seen 09/08/2020 for the evaluation of CHF at the request of Dr. Allena Katz.   History of Present Illness:   Ms. Kathleen [name] has no known cardiac condition and follows no cardiologist previously.   She has been relatively healthy for her whole life until now. She was evaluated by neurology on 07/20/20 for 2 years duration of memory loss. CT brain is pending. She was concerned for new onset of Alzheimer's dementia.   She presented to ER 09/06/20 for SOB that is progressively worsening for 1 week. Daughter is at bedside offering history. She reports patient has not been able to lay flat due to significant dyspnea for the past couple nights. She has been requiring more pillows support at night. She noted that patient is easily winded with simple activities. This is unusual for the patient. Patient does not take any medication nor sees physician historically, she is unaware of any medical conditions patient had. She had not complained any chest pain, dizziness, leg swelling at home. She eats well with frequent snack throughout the day. She ambulates with steady gait and hardly had any falls at home. She is able to communicate simple needs, but has moderate memory loss. Daughter felt patient's breathing is improved after receiving lasix at ED. She had urinated a lot. She states patient had no productive cough, fever at home.   Diagnostic workup here showed unremarkable CBC diff, CMP. Hs Trop 16 >18. BNP 1461. Flu and COVID negative. CXR showed Pleural effusions bilaterally. Suspect bibasilar pneumonia superimposed. Heart size and pulmonary vascularity are normal. Osteoporosis with prior fractures of  the proximal left humerus and T12 vertebral body. Initial EKG showed sinus rhythm, rate of 94 bpm, PVCs, no previous EKG available to compare. She was afebrile, mildly hypertensive with BP 148/83 at ED. She was admitted to hospital medicine, concerned for new onset of CHF, given IV Lasix  total.   Echo completed 09/07/20 with EF 35-40%, LV moderately decreased function, LV global hypokinesis, mild concertric LVH, grade I DD, elevated LVEDP, RV systolic function mildly reduced, normal PASP, RVSP 32.4 mmHg. Mild MR.   She was  found to be in new onset of A fib with RVR 150s on 09/07/20 morning EKG. She was given IV dilatiazem bolus and started on metoprolol for rate control, and started on anticoagulation with therapeutic dose  Lovenox. Cardiology is consulted for further input.    Past Medical History:  Diagnosis Date  . Dementia (HCC)     History reviewed. No pertinent surgical history.   Home Medications:  Prior to Admission medications   Not on File    Inpatient Medications: Scheduled Meds: . [START ON 09/09/2020] apixaban  2.5 mg Oral BID  . metoprolol tartrate  25 mg Oral BID  . sacubitril-valsartan  1 tablet Oral BID   Continuous Infusions:  PRN Meds: acetaminophen **OR** acetaminophen, metoprolol tartrate, ondansetron **OR** ondansetron (ZOFRAN) IV, senna-docusate  Allergies:   No Known Allergies  Social History:   Social History   Socioeconomic History  . Marital status: Widowed    Spouse name: Not on file  . Number of children: Not on file  . Years of education: Not on file  .  Highest education level: Not on file  Occupational History  . Not on file  Tobacco Use  . Smoking status: Never Smoker  . Smokeless tobacco: Never Used  Substance and Sexual Activity  . Alcohol use: Never  . Drug use: Never  . Sexual activity: Not on file  Other Topics Concern  . Not on file  Social History Narrative   Lives with Daughter April   Right Handed   Drinks 1/2 - 1 liter  of Dr. Reino [employer]   Social Determinants of Health   Financial Resource Strain: Not on file  Food Insecurity: Not on file  Transportation Needs: Not on file  Physical Activity: Not on file  Stress: Not on file  Social Connections: Not on file  Intimate Partner Violence: Not on file    Family History:    Family History  Problem Relation Age of Onset  . Stroke Sister   . Leukemia Brother      ROS:  Constitutional: Denied fever, chills Eyes: Denied vision change or loss Ears/Nose/Mouth/Throat: Denied ear ache, sore throat, coughing, sinus pain Cardiovascular: see HPI  Respiratory: see HPI  Gastrointestinal: Denied nausea, vomiting, abdominal pain, diarrhea Genital/Urinary: Denied dysuria, hematuria, urinary frequency/urgency Musculoskeletal: Denied muscle ache, joint pain, weakness Skin: Denied rash, wound Neuro: Memory loss  Psych: Denied history of depression/anxiety  Endocrine: Denied history of diabetes   Physical Exam/Data:   Vitals:   09/08/20 0400 09/08/20 0500 09/08/20 0540 09/08/20 0600  BP:   (!) 126/57   Pulse:   78   Resp: (!) 24 (!) 26  (!) 24  Temp:   97.9 F (36.6 C)   TempSrc:   Oral   SpO2:   95%   Weight:  44.5 kg    Height:        Intake/Output Summary (Last 24 hours) at 09/08/2020 0929 Last data filed at 09/08/2020 0841 Gross per 24 hour  Intake 341.17 ml  Output 700 ml  Net -358.83 ml   Last 3 Weights 09/08/2020 09/06/2020 07/20/2020  Weight (lbs) 98 lb 1.7 oz 104 lb 6.4 oz 101 lb  Weight (kg) 44.5 kg 47.356 kg 45.813 kg     Body mass index is 16.84 kg/m.   Vitals:  Vitals:   09/08/20 0540 09/08/20 0600  BP: (!) 126/57   Pulse: 78   Resp:  (!) 24  Temp: 97.9 F (36.6 C)   SpO2: 95%    General Appearance: In no apparent distress, laying in bed HEENT: Normocephalic, atraumatic. EOMs intact. Sclera anicteric. Oropharynx is clear, tongue midline. Dentition is as expected for age. Neck: Supple, trachea midline, no JVDs, no carotid  bruits. Cardiovascular: Regular rate and rhythm, normal S1-S2,  no murmur/rub/gallop, S3/S4 Respiratory: Resting breathing unlabored, lungs sounds clear to auscultation bilaterally, no use of accessory muscles. On room air.  No wheezes, rales or rhonchi.   Gastrointestinal: Bowel sounds positive, abdomen soft, non-tender, non-distended. No mass or organomegaly.  Extremities: Able to move all extremities in bed without difficulty, no edema/cyanosis/clubbing, DP/PT pulses were 2+ and equal bilaterally Genitourinary: No CVA tenderness / genital exam not performed Musculoskeletal: Normal muscle bulk and tone, muscle strength 5/5 throughout, no limited range of motion, no swollen or erythematous joints Skin: Intact, warm, dry. No rashes or petechiae noted in exposed areas.  Neurologic: Alert, oriented to person, place and time. Fluent speech, no facial droop, no cognitive deficit, no pronator drift, no gross sensory deficit, no focal muscle weakness, no gross focal neuro deficit Psychiatric: Normal  affect. Mood is appropriate.    EKG:  The EKG was personally reviewed and demonstrates: 09/06/20 EKG showed sinus rhythm, rate of 94 bpm, occasional PVCs.  Repeat EKG 09/07/20 with A fib RVR 150s, LVH. No previous EKGs available for comparison.   Telemetry:  Telemetry was personally reviewed and demonstrates:  Sinus rhythm with rate of 70-80s, frequent PVCs, ventricular  bigeminy noted    Relevant CV Studies:   Echo from 09/07/20:  1. Left ventricular ejection fraction, by estimation, is 35 to 40%. The  left ventricle has moderately decreased function. The left ventricle  demonstrates global hypokinesis. There is mild concentric left ventricular  hypertrophy. Left ventricular  diastolic parameters are consistent with Grade I diastolic dysfunction  (impaired relaxation). Elevated left ventricular end-diastolic pressure.  2. Right ventricular systolic function is mildly reduced. The right  ventricular  size is normal. There is normal pulmonary artery systolic  pressure. The estimated right ventricular systolic pressure is 32.4 mmHg.  3. The mitral valve is normal in structure. Mild mitral valve  regurgitation. No evidence of mitral stenosis.  4. The aortic valve is normal in structure. Aortic valve regurgitation is  not visualized. No aortic stenosis is present.  5. The inferior vena cava is normal in size with greater than 50%  respiratory variability, suggesting right atrial pressure of 3 mmHg.    Laboratory Data:  High Sensitivity Troponin:   Recent Labs  Lab 09/06/20 1326 09/06/20 1628  TROPONINIHS 16 18*     Chemistry Recent Labs  Lab 09/06/20 1326 09/07/20 0330 09/08/20 0818  NA 141 143 142  K 4.3 3.8 4.1  CL 109 108 106  CO2 24 28 30   GLUCOSE 94 92 128*  BUN 17 16 23   CREATININE 0.92 0.95 0.96  CALCIUM 9.1 8.8* 9.5  GFRNONAA 60* 58* 57*  ANIONGAP 8 7 6     Recent Labs  Lab 09/06/20 1326  PROT 6.0*  ALBUMIN 3.8  AST 32  ALT 30  ALKPHOS 70  BILITOT 0.5   Hematology Recent Labs  Lab 09/06/20 1326  WBC 5.4  RBC 3.80*  HGB 12.3  HCT 38.6  MCV 101.6*  MCH 32.4  MCHC 31.9  RDW 13.9  PLT 160   BNP Recent Labs  Lab 09/06/20 1326  BNP 1,461.6*    DDimer No results for input(s): DDIMER in the last 168 hours.   Radiology/Studies:  DG Chest 2 View  Result Date: 09/06/2020 CLINICAL DATA:  Shortness of breath EXAM: CHEST - 2 VIEW COMPARISON:  None. FINDINGS: There are pleural effusions bilaterally with patchy airspace opacity in the lower lung regions bilaterally. Heart size and pulmonary vascularity are normal. No adenopathy. There is aortic atherosclerosis. There is severe anterior wedging of the T12 vertebral body. There is an old healed fracture of the proximal left humerus. Bones are osteoporotic. IMPRESSION: Pleural effusions bilaterally. Suspect bibasilar pneumonia superimposed. Heart size and pulmonary vascularity are normal. Osteoporosis  with prior fractures of the proximal left humerus and T12 vertebral body. Aortic Atherosclerosis (ICD10-I70.0). Electronically Signed   By: 11/06/20 III M.D.   On: 09/06/2020 14:12   ECHOCARDIOGRAM COMPLETE  Result Date: 09/07/2020    ECHOCARDIOGRAM REPORT   Patient Name:   VERDELL KINCANNON Date of Exam: 09/07/2020 Medical Rec #:  11/07/2020    Height:       64.0 in Accession #:    Roxan Diesel   Weight:       104.4 lb Date of Birth:  1929-11-13  BSA:          1.484 m Patient Age:    89 years     BP:           91/66 mmHg Patient Gender: F            HR:           86 bpm. Exam Location:  Inpatient Procedure: 2D Echo, 3D Echo, Cardiac Doppler and Color Doppler Indications:    I50.40* Unspecified combined systolic (congestive) and diastolic                 (congestive) heart failure  History:        Patient has no prior history of Echocardiogram examinations.                 CHF, Signs/Symptoms:Dyspnea, Shortness of Breath, Altered Mental                 Status and Alzheimer's; Risk Factors:Hypertension. Hypoxia.  Sonographer:    Sheralyn Boatman RDCS Referring Phys: [id number] CHING T TU  Sonographer Comments: Technically difficult study due to poor echo windows. IMPRESSIONS  1. Left ventricular ejection fraction, by estimation, is 35 to 40%. The left ventricle has moderately decreased function. The left ventricle demonstrates global hypokinesis. There is mild concentric left ventricular hypertrophy. Left ventricular diastolic parameters are consistent with Grade I diastolic dysfunction (impaired relaxation). Elevated left ventricular end-diastolic pressure.  2. Right ventricular systolic function is mildly reduced. The right ventricular size is normal. There is normal pulmonary artery systolic pressure. The estimated right ventricular systolic pressure is 32.4 mmHg.  3. The mitral valve is normal in structure. Mild mitral valve regurgitation. No evidence of mitral stenosis.  4. The aortic valve is normal in structure.  Aortic valve regurgitation is not visualized. No aortic stenosis is present.  5. The inferior vena cava is normal in size with greater than 50% respiratory variability, suggesting right atrial pressure of 3 mmHg. FINDINGS  Left Ventricle: Left ventricular ejection fraction, by estimation, is 35 to 40%. The left ventricle has moderately decreased function. The left ventricle demonstrates global hypokinesis. The left ventricular internal cavity size was normal in size. There is mild concentric left ventricular hypertrophy. Left ventricular diastolic parameters are consistent with Grade I diastolic dysfunction (impaired relaxation). Elevated left ventricular end-diastolic pressure. Right Ventricle: The right ventricular size is normal. No increase in right ventricular wall thickness. Right ventricular systolic function is mildly reduced. There is normal pulmonary artery systolic pressure. The tricuspid regurgitant velocity is 2.71 m/s, and with an assumed right atrial pressure of 3 mmHg, the estimated right ventricular systolic pressure is 32.4 mmHg. Left Atrium: Left atrial size was normal in size. Right Atrium: Right atrial size was normal in size. Pericardium: There is no evidence of pericardial effusion. Mitral Valve: The mitral valve is normal in structure. Mild mitral valve regurgitation. No evidence of mitral valve stenosis. Tricuspid Valve: The tricuspid valve is normal in structure. Tricuspid valve regurgitation is mild . No evidence of tricuspid stenosis. Aortic Valve: The aortic valve is normal in structure. Aortic valve regurgitation is not visualized. No aortic stenosis is present. Pulmonic Valve: The pulmonic valve was normal in structure. Pulmonic valve regurgitation is not visualized. No evidence of pulmonic stenosis. Aorta: The aortic root is normal in size and structure. Venous: The inferior vena cava is normal in size with greater than 50% respiratory variability, suggesting right atrial pressure of  3 mmHg. IAS/Shunts: No atrial level shunt detected by  color flow Doppler.  LEFT VENTRICLE PLAX 2D LVIDd:         4.40 cm     Diastology LVIDs:         3.80 cm     LV e' medial:    2.68 cm/s LV PW:         1.20 cm     LV E/e' medial:  25.5 LV IVS:        1.10 cm     LV e' lateral:   3.99 cm/s LVOT diam:     1.90 cm     LV E/e' lateral: 17.1 LV SV:         53 LV SV Index:   36 LVOT Area:     2.84 cm  LV Volumes (MOD) LV vol d, MOD A2C: 57.3 ml LV vol d, MOD A4C: 58.0 ml LV vol s, MOD A2C: 45.6 ml LV vol s, MOD A4C: 35.4 ml LV SV MOD A2C:     11.7 ml LV SV MOD A4C:     58.0 ml LV SV MOD BP:      18.8 ml RIGHT VENTRICLE            IVC RV S prime:     8.81 cm/s  IVC diam: 1.70 cm TAPSE (M-mode): 1.6 cm LEFT ATRIUM             Index       RIGHT ATRIUM           Index LA diam:        3.95 cm 2.66 cm/m  RA Area:     11.70 cm LA Vol (A2C):   54.6 ml 36.79 ml/m RA Volume:   25.50 ml  17.18 ml/m LA Vol (A4C):   33.8 ml 22.77 ml/m LA Biplane Vol: 46.7 ml 31.47 ml/m  AORTIC VALVE LVOT Vmax:   103.00 cm/s LVOT Vmean:  66.300 cm/s LVOT VTI:    0.186 m  AORTA Ao Root diam: 2.90 cm MITRAL VALVE                TRICUSPID VALVE MV Area (PHT): 4.15 cm     TR Peak grad:   29.4 mmHg MV Decel Time: 183 msec     TR Vmax:        271.00 cm/s MV E velocity: 68.40 cm/s MV A velocity: 105.00 cm/s  SHUNTS MV E/A ratio:  0.65         Systemic VTI:  0.19 m                             Systemic Diam: 1.90 cm Armanda Magic MD Electronically signed by Armanda Magic MD Signature Date/Time: 09/07/2020/4:39:55 PM    Final      Assessment and Plan:   Acute hypoxic respiratory failure - weaned off oxygen as tolerated, PT   Acute systolic and diastolic heart failure, new  - presented with SOB, orthopnea; denied ever having any chest pain/pressure - BNP 1461 - HS Trop 16 >18 - CXR with bilateral pleural effusions  - weight is down from 104 > 98 pounds, Net - since admission - received IV Lasix  total so far, diuresed well,  euvolemic today, will transition to PO Lasix  daily today - GDMT: agree with starting metoprolol  BID, will start Entresto 24/26 today, may add MRA and SGLT2i outpatient if BP/renal function allows  - Suspect etiology include paroxysmal A fib  RVR induced CM, chronic hypertension, ischemia. Can pursue outpatient stress test for ischemic evaluation.   Paroxysmal A. fib with RVR, new  - found to be in A fib with RVR 150s on 09/07/20 morning EKG - she has converted after IV dilatiazem bolus and started on metoprolol 25mg  BID for rate control, and started on anticoagulation with therapeutic dose  Lovenox  - CHA2DS2VASc score is 4 due to age, gender, HTN, CHF; she has good ambulatory balance and hardly falls at home, she has not hx of major bleeding, discussed risk versus benefit with anticoagulation,daughter is agreeable with anticoagulation,  recommend to start Eliquis 2.5 mg BID (>80 yrs and underweight), would re-assess tolerance and bleeding risk at office visit.   Hypertension - BP improving after starting metoprolol, will add Entresto today      Risk Assessment/Risk Scores:      New York Heart Association (NYHA) Functional Class NYHA Class II  CHA2DS2-VASc Score = 5  }This indicates a 7.2% annual risk of stroke. The patient's score is based upon: CHF History: Yes HTN History: Yes Diabetes History: No Stroke History: No Vascular Disease History: No Age Score: 2 Gender Score: 1        For questions or updates, please contact [facility] Please consult [email or web address] for contact info under    Signed, , NP  09/08/2020 9:29 AM   History and all data above reviewed.  Patient examined.  I agree with the findings as above.  This absolutely lovely lady has no past cardiac history.  She presents with relatively acute DOE that progressed to PND/orthopnea.  Found to have acute systolic HF as above.  Also with documented PAF but now in NSR.  No chest pain.  Otherwise  had been doing really well.  She came in on no meds!  Her daughter lives with her and takes great care of her.  The patient is demented but pleasant.    The patient exam reveals COR:RRR  ,  Lungs: Decreased breath sounds with bilateral basilar crackles  ,  Abd: Positive bowel sounds, no rebound no guarding, Ext No edema   .  All available labs, radiology testing, previous records reviewed. Agree with documented assessment and plan.   Acute systolic HF:  I agree with plans as above.  I will slowly titrate her meds as an out patient.  Continue IV diuresis.  I am not planning an ischemia work up.  I discussed this with the daughter in the room.  I will follow up and consider this based on response to therapy.   Atrial fib:  I will arrange a 4 week monitor at home.  She has no contraindication to DOAC.    11/08/2020 Farouk Vivero  11:15 AM  09/08/2020

## 2020-09-08 NOTE — Progress Notes (Signed)
[facility] Progress Note  Patient: Kathleen [name]    YWV:[id number]  DOA: 09/06/2020     Date of Service: the patient was seen and examined on 09/08/2020  Brief hospital course: Past medical history of mild cognitive imbalance.  Presents with complaints of shortness of breath, orthopnea.  Work-up so far consistent with acute CHF.  Found to have A. fib with RVR during the hospital stay. Currently plan is rate control and treatment of CHF.  Assessment and Plan: 1.  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 fib with RVR. Echocardiogram completed EF is 35%. Given IV Lasix. Clinical examination appears to be euvolemic therefore we will discontinue IV Lasix and transition to p.o. Cardiology was consulted to check for EF. No further ischemic work-up in the hospital.  2.  Acute hypoxic respiratory failure, POA Currently able to come off of the oxygen. Currently on 2 LPM on exertion. Monitor.  3.  Paroxysmal A. fib with RVR Patient was given 1 dose of IV Cardizem without any improvement. Currently on Cardizem drip. For therapeutic anticoagulation discussed with patient and daughter in detail.  Patient does not have any active recurrent fall or bleeding history. [place] Vascor is 5. Initiating on Eliquis.  4.  Essential hypertension Currently on Cardizem.  Holding lisinopril.  5.  Underweight Placing the patient at high risk of poor outcome. Monitor. Body mass index is 16.84 kg/m.   Diet: Cardiac diet DVT Prophylaxis: Subcutaneous Lovenox  apixaban (ELIQUIS) tablet 2.5 mg Start: 09/09/20 1000 apixaban (ELIQUIS) tablet 2.5 mg    Advance goals of care discussion: Full code  Family Communication: family was present at bedside, at the time of interview.  The pt provided permission to discuss medical plan with the family. Opportunity was given to ask question and all questions were answered  satisfactorily.   Disposition:  Status is: Inpatient  Remains inpatient appropriate because:Ongoing diagnostic testing needed not appropriate for outpatient work up   Dispo: The patient is from: Home              Anticipated d/c is to: Home              Patient currently is not medically stable to d/c.   Difficult to place patient No  Subjective: Continues to have shortness of breath and cough.  No nausea or vomiting.    Physical Exam:  General: Appear in mild distress, no Rash; Oral Mucosa Clear, moist. no Abnormal Neck Mass Or lumps, Conjunctiva normal  Cardiovascular: S1 and S2 Present, no Murmur, Respiratory: good respiratory effort, Bilateral Air entry present and bilateral faint crackles, no wheezes Abdomen: Bowel Sound present, Soft and no tenderness Extremities: no Pedal edema Neurology: alert and oriented to time, place, and person affect appropriate. no new focal deficit Gait not checked due to patient safety concerns   Vitals:   09/08/20 0540 09/08/20 0600 09/08/20 1200 09/08/20 1344  BP: (!) 126/57   107/64  Pulse: 78   80  Resp:  (!) 24  18  Temp: 97.9 F (36.6 C)   98.1 F (36.7 C)  TempSrc: Oral   Oral  SpO2: 95%  92% 92%  Weight:      Height:        Intake/Output Summary (Last 24 hours) at 09/08/2020 1926 Last data filed at 09/08/2020 1345 Gross per 24 hour  Intake 445 ml  Output 250 ml  Net 195 ml   Filed Weights   09/06/20  1259 09/08/20 0500  Weight: 47.4 kg 44.5 kg    Data Reviewed: I have personally reviewed and interpreted daily labs, tele strips, imaging. I reviewed all nursing notes, pharmacy notes, vitals, pertinent old records I have discussed plan of care as described above with RN and patient/family.  CBC: Recent Labs  Lab 09/06/20 1326  WBC 5.4  NEUTROABS 3.7  HGB 12.3  HCT 38.6  MCV 101.6*  PLT 160   Basic Metabolic Panel: Recent Labs  Lab 09/06/20 1326 09/07/20 0330 09/08/20 0818  NA 141 143 142  K 4.3 3.8 4.1  CL  109 108 106  CO2 24 28 30   GLUCOSE 94 92 128*  BUN 17 16 23   CREATININE 0.92 0.95 0.96  CALCIUM 9.1 8.8* 9.5  MG  --   --  2.0    Studies: No results found.  Scheduled Meds: . [START ON 09/09/2020] apixaban  2.5 mg Oral BID  . furosemide  20 mg Oral Daily  . metoprolol tartrate  25 mg Oral BID  . sacubitril-valsartan  1 tablet Oral BID   Continuous Infusions:  PRN Meds: acetaminophen **OR** acetaminophen, metoprolol tartrate, ondansetron **OR** ondansetron (ZOFRAN) IV, senna-docusate  Time spent: 35 minutes  Author: , MD [facility] 09/08/2020 7:26 PM  To reach On-call, see care teams to locate the attending and reach out via [email or web address]. Between 7PM-7AM, please contact night-coverage If you still have difficulty reaching the attending provider, please page the [facility] (Director on Call) for [facility] on amion for assistance.

## 2020-09-09 DIAGNOSIS — I5021 Acute systolic (congestive) heart failure: Secondary | ICD-10-CM

## 2020-09-09 MED ORDER — FUROSEMIDE 10 MG/ML IJ SOLN: 20.0000 mg | Freq: Two times a day (BID) | INTRAMUSCULAR | Status: DC

## 2020-09-09 MED ORDER — FUROSEMIDE 40 MG PO TABS
40.0000 mg | ORAL_TABLET | Freq: Every day | ORAL | Status: DC
  Filled 2020-09-09: qty 1

## 2020-09-09 MED ORDER — FUROSEMIDE 20 MG PO TABS
20.0000 mg | ORAL_TABLET | Freq: Every day | ORAL | Status: DC
  Administered 2020-09-10 – 2020-09-11 (×2): 20 mg via ORAL
  Filled 2020-09-09 (×2): qty 1

## 2020-09-09 NOTE — Plan of Care (Signed)
  Problem: Education: Goal: Knowledge of General Education information will improve Description Including pain rating scale, medication(s)/side effects and non-pharmacologic comfort measures Outcome: Progressing   

## 2020-09-09 NOTE — Progress Notes (Signed)
Progress Note  Patient Name: Kathleen [name] Date of Encounter: 09/09/2020  [facility] Cardiologist: None   Subjective   Hypotensive to SBP 90s this morning, Entresto held.  Net -155 cc yesterday but incomplete I's and O's, net -1.4 L on admission.  Oriented to person only  Inpatient Medications    Scheduled Meds: . apixaban  2.5 mg Oral BID  . furosemide  40 mg Oral Daily  . metoprolol tartrate  25 mg Oral BID  . sacubitril-valsartan  1 tablet Oral BID   Continuous Infusions:  PRN Meds: acetaminophen **OR** acetaminophen, metoprolol tartrate, ondansetron **OR** ondansetron (ZOFRAN) IV, senna-docusate   Vital Signs    Vitals:   09/08/20 2031 09/09/20 0437 09/09/20 0911 09/09/20 0925  BP: 131/69 (!) 108/58 99/86   Pulse: 89 70 (!) 48 78  Resp: 20 14 20    Temp: 98.4 F (36.9 C) (!) 97.5 F (36.4 C) 98.8 F (37.1 C)   TempSrc: Oral  Oral   SpO2: 93% (!) 87% 94%   Weight:      Height:        Intake/Output Summary (Last 24 hours) at 09/09/2020 0957 Last data filed at 09/09/2020 0500 Gross per 24 hour  Intake 360 ml  Output 600 ml  Net -240 ml   Last 3 Weights 09/08/2020 09/06/2020 07/20/2020  Weight (lbs) 98 lb 1.7 oz 104 lb 6.4 oz 101 lb  Weight (kg) 44.5 kg 47.356 kg 45.813 kg      Telemetry    Normal sinus rhythm in 70s, frequent PVCs, NSVT up to 5 beats- Personally Reviewed  ECG    No new ECG- Personally Reviewed  Physical Exam   GEN: No acute distress.   Neck: No JVD Cardiac: RRR, no murmurs, rubs, or gallops.  Respiratory: Clear to auscultation bilaterally. GI: Soft, nontender, non-distended  MS: No edema; No deformity. Neuro:   Oriented to person only Psych: Normal affect   Labs    High Sensitivity Troponin:   Recent Labs  Lab 09/06/20 1326 09/06/20 1628  TROPONINIHS 16 18*      Chemistry Recent Labs  Lab 09/06/20 1326 09/07/20 0330 09/08/20 0818  NA 141 143 142  K 4.3 3.8 4.1  CL 109 108 106  CO2 24 28 30   GLUCOSE 94 92 128*   BUN 17 16 23   CREATININE 0.92 0.95 0.96  CALCIUM 9.1 8.8* 9.5  PROT 6.0*  --   --   ALBUMIN 3.8  --   --   AST 32  --   --   ALT 30  --   --   ALKPHOS 70  --   --   BILITOT 0.5  --   --   GFRNONAA 60* 58* 57*  ANIONGAP 8 7 6      Hematology Recent Labs  Lab 09/06/20 1326  WBC 5.4  RBC 3.80*  HGB 12.3  HCT 38.6  MCV 101.6*  MCH 32.4  MCHC 31.9  RDW 13.9  PLT 160    BNP Recent Labs  Lab 09/06/20 1326  BNP 1,461.6*     DDimer No results for input(s): DDIMER in the last 168 hours.   Radiology    ECHOCARDIOGRAM COMPLETE  Result Date: 09/07/2020    ECHOCARDIOGRAM REPORT   Patient Name:   Kathleen [name] Date of Exam: 09/07/2020 Medical Rec #:  11/06/20    Height:       64.0 in Accession #:    11/07/2020   Weight:  104.4 lb Date of Birth:  07-Dec-1929    BSA:          1.484 m Patient Age:    87 years     BP:           91/66 mmHg Patient Gender: F            HR:           86 bpm. Exam Location:  Inpatient Procedure: 2D Echo, 3D Echo, Cardiac Doppler and Color Doppler Indications:    I50.40* Unspecified combined systolic (congestive) and diastolic                 (congestive) heart failure  History:        Patient has no prior history of Echocardiogram examinations.                 CHF, Signs/Symptoms:Dyspnea, Shortness of Breath, Altered Mental                 Status and Alzheimer's; Risk Factors:Hypertension. Hypoxia.  Sonographer:    Sheralyn Boatman RDCS Referring Phys: [id number] CHING T TU  Sonographer Comments: Technically difficult study due to poor echo windows. IMPRESSIONS  1. Left ventricular ejection fraction, by estimation, is 35 to 40%. The left ventricle has moderately decreased function. The left ventricle demonstrates global hypokinesis. There is mild concentric left ventricular hypertrophy. Left ventricular diastolic parameters are consistent with Grade I diastolic dysfunction (impaired relaxation). Elevated left ventricular end-diastolic pressure.  2. Right ventricular  systolic function is mildly reduced. The right ventricular size is normal. There is normal pulmonary artery systolic pressure. The estimated right ventricular systolic pressure is 32.4 mmHg.  3. The mitral valve is normal in structure. Mild mitral valve regurgitation. No evidence of mitral stenosis.  4. The aortic valve is normal in structure. Aortic valve regurgitation is not visualized. No aortic stenosis is present.  5. The inferior vena cava is normal in size with greater than 50% respiratory variability, suggesting right atrial pressure of 3 mmHg. FINDINGS  Left Ventricle: Left ventricular ejection fraction, by estimation, is 35 to 40%. The left ventricle has moderately decreased function. The left ventricle demonstrates global hypokinesis. The left ventricular internal cavity size was normal in size. There is mild concentric left ventricular hypertrophy. Left ventricular diastolic parameters are consistent with Grade I diastolic dysfunction (impaired relaxation). Elevated left ventricular end-diastolic pressure. Right Ventricle: The right ventricular size is normal. No increase in right ventricular wall thickness. Right ventricular systolic function is mildly reduced. There is normal pulmonary artery systolic pressure. The tricuspid regurgitant velocity is 2.71 m/s, and with an assumed right atrial pressure of 3 mmHg, the estimated right ventricular systolic pressure is 32.4 mmHg. Left Atrium: Left atrial size was normal in size. Right Atrium: Right atrial size was normal in size. Pericardium: There is no evidence of pericardial effusion. Mitral Valve: The mitral valve is normal in structure. Mild mitral valve regurgitation. No evidence of mitral valve stenosis. Tricuspid Valve: The tricuspid valve is normal in structure. Tricuspid valve regurgitation is mild . No evidence of tricuspid stenosis. Aortic Valve: The aortic valve is normal in structure. Aortic valve regurgitation is not visualized. No aortic  stenosis is present. Pulmonic Valve: The pulmonic valve was normal in structure. Pulmonic valve regurgitation is not visualized. No evidence of pulmonic stenosis. Aorta: The aortic root is normal in size and structure. Venous: The inferior vena cava is normal in size with greater than 50% respiratory variability, suggesting right atrial pressure  of 3 mmHg. IAS/Shunts: No atrial level shunt detected by color flow Doppler.  LEFT VENTRICLE PLAX 2D LVIDd:         4.40 cm     Diastology LVIDs:         3.80 cm     LV e' medial:    2.68 cm/s LV PW:         1.20 cm     LV E/e' medial:  25.5 LV IVS:        1.10 cm     LV e' lateral:   3.99 cm/s LVOT diam:     1.90 cm     LV E/e' lateral: 17.1 LV SV:         53 LV SV Index:   36 LVOT Area:     2.84 cm  LV Volumes (MOD) LV vol d, MOD A2C: 57.3 ml LV vol d, MOD A4C: 58.0 ml LV vol s, MOD A2C: 45.6 ml LV vol s, MOD A4C: 35.4 ml LV SV MOD A2C:     11.7 ml LV SV MOD A4C:     58.0 ml LV SV MOD BP:      18.8 ml RIGHT VENTRICLE            IVC RV S prime:     8.81 cm/s  IVC diam: 1.70 cm TAPSE (M-mode): 1.6 cm LEFT ATRIUM             Index       RIGHT ATRIUM           Index LA diam:        3.95 cm 2.66 cm/m  RA Area:     11.70 cm LA Vol (A2C):   54.6 ml 36.79 ml/m RA Volume:   25.50 ml  17.18 ml/m LA Vol (A4C):   33.8 ml 22.77 ml/m LA Biplane Vol: 46.7 ml 31.47 ml/m  AORTIC VALVE LVOT Vmax:   103.00 cm/s LVOT Vmean:  66.300 cm/s LVOT VTI:    0.186 m  AORTA Ao Root diam: 2.90 cm MITRAL VALVE                TRICUSPID VALVE MV Area (PHT): 4.15 cm     TR Peak grad:   29.4 mmHg MV Decel Time: 183 msec     TR Vmax:        271.00 cm/s MV E velocity: 68.40 cm/s MV A velocity: 105.00 cm/s  SHUNTS MV E/A ratio:  0.65         Systemic VTI:  0.19 m                             Systemic Diam: 1.90 cm Armanda Magic MD Electronically signed by Armanda Magic MD Signature Date/Time: 09/07/2020/4:39:55 PM    Final     Cardiac Studies     Patient Profile     87 y.o. female with a hx of  dementia who presents with acute combined systolic and diastolic heart failure and atrial fibrillation with RVR.  Assessment & Plan    Acute combined systolic and diastolic heart failure: Presented with shortness of breath.  BNP 1461, chest x-ray with bilateral pleural effusions.  Echocardiogram shows LVEF 35 to 40%, global hypokinesis, mild RV dysfunction, mild MR.  New diagnosis, could be tachycardia induced given also with A. fib with RVR to 150s -Not a good candidate for ischemia evaluation given age and comorbidities, including dementia.  Plan medical management.  This was discussed with her daughter and she is in agreement with this -Transition to p.o. Lasix 20 mg daily -Continue metoprolol 25 mg twice daily.  Started on Entresto 24- 26 mg twice daily but hypotensive this morning.  We will hold Entresto.  Can plan to follow-up as an outpatient and add likely low-dose losartan if BP tolerates  Paroxysmal A. fib with RVR, new: found to be in A fib with RVR 150s on 09/07/20 morning EKG. chest event score 5 given CHF, hypertension, age 61x2,female -Risk and benefits of anticoagulation were discussed with daughter, agreeable with starting Eliquis 2.5 mg twice daily, reduced dose given age greater than 80 and weight less than 60 kg -Continue metoprolol 25 mg twice daily   For questions or updates, please contact [facility] Please consult [email or web address] for contact info under        Signed, [place] Ishikawahristopher L Kashius Dominic, MD  09/09/2020, 9:57 AM

## 2020-09-09 NOTE — Progress Notes (Signed)
[facility] Progress Note  Patient: Kathleen [name]    BWI:[id number]  DOA: 09/06/2020     Date of Service: the patient was seen and examined on 09/09/2020  Brief hospital course: Past medical history of mild cognitive imbalance.  Presents with complaints of shortness of breath, orthopnea.  Work-up so far consistent with acute CHF.  Found to have A. fib with RVR during the hospital stay. Currently plan is monitor for stabilization of volume status.  Assessment and Plan: 1.  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 fib with RVR. Echocardiogram completed EF is 35%. Given IV Lasix. Clinical examination appears to be euvolemic therefore we will discontinue IV Lasix and transition to p.o. Cardiology was consulted to check for EF. No further ischemic work-up in the hospital.  2.  Acute hypoxic respiratory failure, POA Currently able to come off of the oxygen. Currently on 2 LPM on exertion. Monitor.  3.  Paroxysmal A. fib with RVR Patient was given 1 dose of IV Cardizem without any improvement. Currently on Cardizem drip. For therapeutic anticoagulation discussed with patient and daughter in detail.  Patient does not have any active recurrent fall or bleeding history. [place] Vascor is 5. Initiating on Eliquis.  4.  Essential hypertension Currently on Lopressor, Entresto and Lasix.  Discontinue lisinopril.  5.  Underweight Placing the patient at high risk of poor outcome. Monitor. Body mass index is 16.84 kg/m.   Diet: Cardiac diet DVT Prophylaxis: Subcutaneous Lovenox  apixaban (ELIQUIS) tablet 2.5 mg Start: 09/09/20 1000 apixaban (ELIQUIS) tablet 2.5 mg    Advance goals of care discussion: Full code  Family Communication: family was present at bedside, at the time of interview.  The pt provided permission to discuss medical plan with the family. Opportunity was given to ask question and all  questions were answered satisfactorily.   Disposition:  Status is: Inpatient  Remains inpatient appropriate because:Ongoing diagnostic testing needed not appropriate for outpatient work up   Dispo: The patient is from: Home              Anticipated d/c is to: Home              Patient currently is not medically stable to d/c.   Difficult to place patient No  Subjective: Shortness of breath still present with increased respiratory rate throughout the night.  Patient denies any complaints of shortness of breath nausea vomiting or chest pain or abdominal pain.  Physical Exam:  General: Appear in mild distress, no Rash; Oral Mucosa Clear, moist. no Abnormal Neck Mass Or lumps, Conjunctiva normal  Cardiovascular: S1 and S2 Present, no Murmur, Respiratory: good respiratory effort, Bilateral Air entry present and CTA, faint basal crackles, no wheezes Abdomen: Bowel Sound present, Soft and no tenderness Extremities: no Pedal edema Neurology: alert and oriented to time, place, and person affect appropriate. no new focal deficit Gait not checked due to patient safety concerns   Vitals:   09/09/20 0911 09/09/20 0925 09/09/20 1054 09/09/20 1500  BP: 99/86  102/60 126/67  Pulse: (!) 48 78 80 80  Resp: 20  (!) 25 (!) 24  Temp: 98.8 F (37.1 C)  97.9 F (36.6 C) 98 F (36.7 C)  TempSrc: Oral  Oral Oral  SpO2: 94%  93% 93%  Weight:      Height:        Intake/Output Summary (Last 24 hours) at 09/09/2020 1725 Last data filed at 09/09/2020 6384 Gross  per 24 hour  Intake 120 ml  Output 500 ml  Net -380 ml   Filed Weights   09/06/20 1259 09/08/20 0500  Weight: 47.4 kg 44.5 kg    Data Reviewed: I have personally reviewed and interpreted daily labs, tele strips, imaging. I reviewed all nursing notes, pharmacy notes, vitals, pertinent old records I have discussed plan of care as described above with RN and patient/family.  CBC: Recent Labs  Lab 09/06/20 1326  WBC 5.4  NEUTROABS 3.7   HGB 12.3  HCT 38.6  MCV 101.6*  PLT 160   Basic Metabolic Panel: Recent Labs  Lab 09/06/20 1326 09/07/20 0330 09/08/20 0818  NA 141 143 142  K 4.3 3.8 4.1  CL 109 108 106  CO2 24 28 30   GLUCOSE 94 92 128*  BUN 17 16 23   CREATININE 0.92 0.95 0.96  CALCIUM 9.1 8.8* 9.5  MG  --   --  2.0    Studies: No results found.  Scheduled Meds: . apixaban  2.5 mg Oral BID  . [START ON 09/10/2020] furosemide  20 mg Oral Daily  . metoprolol tartrate  25 mg Oral BID   Continuous Infusions:  PRN Meds: acetaminophen **OR** acetaminophen, metoprolol tartrate, ondansetron **OR** ondansetron (ZOFRAN) IV, senna-docusate  Time spent: 35 minutes  Author: , MD [facility] 09/09/2020 5:25 PM  To reach On-call, see care teams to locate the attending and reach out via [email or web address]. Between 7PM-7AM, please contact night-coverage If you still have difficulty reaching the attending provider, please page the [facility] (Director on Call) for [facility] on amion for assistance.

## 2020-09-09 NOTE — Progress Notes (Signed)
  Dr. Berle Mull, MD was paged and made aware of patient's low heart rate and soft BP.  Received the following verbal orders from Dr. Berle Mull, MD: Verify heart rate and do not bolus. Got a radial reading (heart rate of 78).  With the heart rate of 78 (radial), Dr. Berle Mull, MD stated to hold Entresto and Lasix tab (40 mg) and to give Lopressor. Lopressor 25 mg given at 0931 per verbal order from Dr. Berle Mull, MD.   Also, new orders from Dr. Oswaldo Milian, MD: Lasix tab (20 mg) and D/C Entresto and the Lasix 40 mg dose.   Yellow MEWS protocol has been implemented. Patient is alert and oriented and was finishing up breakfast during shift assessment. Will continue to monitor patient.     09/09/20 0911  Assess: MEWS Score  Temp 98.8 F (37.1 C)  BP 99/86  Pulse Rate (!) 48  Resp 20  Level of Consciousness Alert  SpO2 94 %  O2 Device Room Air  Assess: MEWS Score  MEWS Temp 0  MEWS Systolic 1  MEWS Pulse 1  MEWS RR 0  MEWS LOC 0  MEWS Score 2  MEWS Score Color Yellow  Assess: if the MEWS score is Yellow or Red  Were vital signs taken at a resting state? Yes  Focused Assessment  (Shift assessment completed.)  Does the patient meet 2 or more of the SIRS criteria? No  MEWS guidelines implemented *See Row Information* Yes (Yellow MEWS protocol implemented)  Treat  MEWS Interventions Other (Comment)  Pain Scale 0-10  Pain Score 0  Take Vital Signs  Increase Vital Sign Frequency  Yellow: Q 2hr X 2 then Q 4hr X 2, if remains yellow, continue Q 4hrs  Escalate  MEWS: Escalate Yellow: discuss with charge nurse/RN and consider discussing with provider and RRT (Discussed this with Chancy Hurter, Charge RN)  Notify: Charge Nurse/RN  Name of Charge Nurse/RN Notified Chancy Hurter, RN  Date Charge Nurse/RN Notified 09/09/20  Time Charge Nurse/RN Notified 0915  Notify: Provider  Provider Name/Title Dr. Berle Mull, MD  Date Provider Notified 09/09/20  Time Provider  Notified [phone]  Notification Type Page  Notification Reason Change in status  Provider response See new orders  Date of Provider Response 09/09/20  Time of Provider Response [phone]  Notify: Rapid Response  Name of Rapid Response RN Notified  (No rapid response call. SIRS criteria not met. Also, patient is alert and oriented.)  Assess: SIRS CRITERIA  SIRS Temperature  0  SIRS Pulse 0  SIRS Respirations  0  SIRS WBC 0  SIRS Score Sum  0

## 2020-09-09 NOTE — Progress Notes (Signed)
MEWS score now green.  

## 2020-09-10 DIAGNOSIS — I5041 Acute combined systolic (congestive) and diastolic (congestive) heart failure: Secondary | ICD-10-CM

## 2020-09-10 DIAGNOSIS — I480 Paroxysmal atrial fibrillation: Secondary | ICD-10-CM

## 2020-09-10 LAB — MAGNESIUM: Magnesium: 2.3 mg/dL (ref 1.7–2.4)

## 2020-09-10 LAB — BASIC METABOLIC PANEL
Anion gap: 8 (ref 5–15)
BUN: 17 mg/dL (ref 8–23)
CO2: 29 mmol/L (ref 22–32)
Calcium: 9.2 mg/dL (ref 8.9–10.3)
Chloride: 104 mmol/L (ref 98–111)
Creatinine, Ser: 0.93 mg/dL (ref 0.44–1.00)
GFR, Estimated: 59 mL/min — ABNORMAL LOW (ref >60)
Glucose, Bld: 96 mg/dL (ref 70–99)
Potassium: 4 mmol/L (ref 3.5–5.1)
Sodium: 141 mmol/L (ref 135–145)

## 2020-09-10 MED ORDER — METOPROLOL SUCCINATE ER 50 MG PO TB24
50.0000 mg | ORAL_TABLET | Freq: Every day | ORAL | Status: DC
  Administered 2020-09-10 – 2020-09-11 (×2): 50 mg via ORAL
  Filled 2020-09-10 (×2): qty 1

## 2020-09-10 NOTE — Progress Notes (Addendum)
Contacted by [employer]. Patient has a 3 Beat run of Supraventricular tachycardia.Patient assessed and  Noted to be asleep with no changes in condition. Will continue to monitor.

## 2020-09-10 NOTE — Progress Notes (Signed)
[facility] Progress Note  Patient: Kathleen [name]    BHA:[id number]  DOA: 09/06/2020     Date of Service: the patient was seen and examined on 09/10/2020  Brief hospital course: Past medical history of mild cognitive imbalance.  Presents with complaints of shortness of breath, orthopnea.  Work-up so far consistent with acute CHF.  Found to have A. fib with RVR during the hospital stay. Currently plan is monitor optimization cardiac medication.  Assessment and Plan: 1.  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 fib with RVR. Echocardiogram completed EF is 35%. Given IV Lasix. No further ischemic work-up in the hospital. Currently on Toprol-XL and Lasix. Unable to tolerate Entresto.   2.  Acute hypoxic respiratory failure, POA Currently able to come off of the oxygen. Currently on 2 LPM on exertion. Monitor.  3.  Paroxysmal A. fib with RVR Patient was given 1 dose of IV Cardizem without any improvement. Currently on Cardizem drip. For therapeutic anticoagulation discussed with patient and daughter in detail.  Patient does not have any active recurrent fall or bleeding history. [place] Vascor is 5. Now on Eliquis.  4.  Essential hypertension Currently on Lopressor and Lasix.  Discontinue lisinopril. Switching Lopressor to Toprol-XL. Unable to tolerate Entresto.  5.  Underweight Placing the patient at high risk of poor outcome. Monitor. Body mass index is 16.61 kg/m.   6.  Dementia. Intermittently confused. Currently no focal deficit. Monitor.  Diet: Cardiac diet DVT Prophylaxis: Subcutaneous Lovenox  apixaban (ELIQUIS) tablet 2.5 mg Start: 09/09/20 1000 apixaban (ELIQUIS) tablet 2.5 mg    Advance goals of care discussion: Limited code, okay for BiPAP.  No CPR.  No intubation.  Family Communication: family was present at bedside, at the time of interview.  The pt provided permission to  discuss medical plan with the family. Opportunity was given to ask question and all questions were answered satisfactorily.   Disposition:  Status is: Inpatient  Remains inpatient appropriate because:Ongoing diagnostic testing needed not appropriate for outpatient work up   Dispo: The patient is from: Home              Anticipated d/c is to: Home              Patient currently is not medically stable to d/c.   Difficult to place patient No  Subjective: Continues to have shortness of breath.  No nausea, vomiting.  No fever no chills.  Also has cough.  Asking me where she is.  Physical Exam:  General: Appear in mild distress, no Rash; Oral Mucosa Clear, moist. no Abnormal Neck Mass Or lumps, Conjunctiva normal  Cardiovascular: S1 and S2 Present, no Murmur, Respiratory: good respiratory effort, Bilateral Air entry present and bilateral Crackles, no wheezes Abdomen: Bowel Sound present, Soft and no tenderness Extremities: no Pedal edema Neurology: alert and oriented to time, place, and person affect appropriate. no new focal deficit Gait not checked due to patient safety concerns  Vitals:   09/10/20 0535 09/10/20 1019 09/10/20 1021 09/10/20 1350  BP: (!) 125/49 102/88 102/88 (!) 103/46  Pulse: 73 88 88 79  Resp: 16   (!) 24  Temp: 98.4 F (36.9 C)   98.3 F (36.8 C)  TempSrc:    Oral  SpO2: (!) 88%   95%  Weight:      Height:        Intake/Output Summary (Last 24 hours) at 09/10/2020 1545 Last data filed at  09/10/2020 1352 Gross per 24 hour  Intake 320 ml  Output 1250 ml  Net -930 ml   Filed Weights   09/06/20 1259 09/08/20 0500 09/10/20 0500  Weight: 47.4 kg 44.5 kg 43.9 kg    Data Reviewed: I have personally reviewed and interpreted daily labs, tele strips, imaging. I reviewed all nursing notes, pharmacy notes, vitals, pertinent old records I have discussed plan of care as described above with RN and patient/family.  CBC: Recent Labs  Lab 09/06/20 1326  WBC 5.4   NEUTROABS 3.7  HGB 12.3  HCT 38.6  MCV 101.6*  PLT 160   Basic Metabolic Panel: Recent Labs  Lab 09/06/20 1326 09/07/20 0330 09/08/20 0818 09/10/20 0743  NA 141 143 142 141  K 4.3 3.8 4.1 4.0  CL 109 108 106 104  CO2 24 28 30 29   GLUCOSE 94 92 128* 96  BUN 17 16 23 17   CREATININE 0.92 0.95 0.96 0.93  CALCIUM 9.1 8.8* 9.5 9.2  MG  --   --  2.0 2.3    Studies: No results found.  Scheduled Meds: . apixaban  2.5 mg Oral BID  . furosemide  20 mg Oral Daily  . metoprolol succinate  50 mg Oral Daily   Continuous Infusions:  PRN Meds: acetaminophen **OR** acetaminophen, metoprolol tartrate, ondansetron **OR** ondansetron (ZOFRAN) IV, senna-docusate  Time spent: 35 minutes  Author: , MD [facility] 09/10/2020 3:45 PM  To reach On-call, see care teams to locate the attending and reach out via [email or web address]. Between 7PM-7AM, please contact night-coverage If you still have difficulty reaching the attending provider, please page the [facility] (Director on Call) for [facility] on amion for assistance.

## 2020-09-10 NOTE — Progress Notes (Signed)
Progress Note  Patient Name: Kathleen [name] Date of Encounter: 09/10/2020  [facility] Cardiologist: None   Subjective   BP improved, wanted to radiate this morning.  Net even yesterday but incomplete I's and O's.  Oriented to person and place this morning.  Denies any chest pain or dyspnea.  Inpatient Medications    Scheduled Meds: . apixaban  2.5 mg Oral BID  . furosemide  20 mg Oral Daily  . metoprolol succinate  50 mg Oral Daily   Continuous Infusions:  PRN Meds: acetaminophen **OR** acetaminophen, metoprolol tartrate, ondansetron **OR** ondansetron (ZOFRAN) IV, senna-docusate   Vital Signs    Vitals:   09/10/20 0500 09/10/20 0535 09/10/20 1019 09/10/20 1021  BP:  (!) 125/49 102/88 102/88  Pulse:  73 88 88  Resp:  16    Temp:  98.4 F (36.9 C)    TempSrc:      SpO2:  (!) 88%    Weight: 43.9 kg     Height:        Intake/Output Summary (Last 24 hours) at 09/10/2020 1045 Last data filed at 09/10/2020 1005 Gross per 24 hour  Intake 360 ml  Output 800 ml  Net -440 ml   Last 3 Weights 09/10/2020 09/08/2020 09/06/2020  Weight (lbs) 96 lb 12.5 oz 98 lb 1.7 oz 104 lb 6.4 oz  Weight (kg) 43.9 kg 44.5 kg 47.356 kg      Telemetry    Normal sinus rhythm in 70-110s, frequent PVCs- Personally Reviewed  ECG    No new ECG- Personally Reviewed  Physical Exam   GEN: No acute distress.   Neck: No JVD Cardiac: RRR, no murmurs, rubs, or gallops.  Respiratory: Clear to auscultation bilaterally. GI: Soft, nontender, non-distended  MS: No edema; No deformity. Neuro:   Oriented to person only Psych: Normal affect   Labs    High Sensitivity Troponin:   Recent Labs  Lab 09/06/20 1326 09/06/20 1628  TROPONINIHS 16 18*      Chemistry Recent Labs  Lab 09/06/20 1326 09/07/20 0330 09/08/20 0818 09/10/20 0743  NA 141 143 142 141  K 4.3 3.8 4.1 4.0  CL 109 108 106 104  CO2 24 28 30 29   GLUCOSE 94 92 128* 96  BUN 17 16 23 17   CREATININE 0.92 0.95 0.96 0.93   CALCIUM 9.1 8.8* 9.5 9.2  PROT 6.0*  --   --   --   ALBUMIN 3.8  --   --   --   AST 32  --   --   --   ALT 30  --   --   --   ALKPHOS 70  --   --   --   BILITOT 0.5  --   --   --   GFRNONAA 60* 58* 57* 59*  ANIONGAP 8 7 6 8      Hematology Recent Labs  Lab 09/06/20 1326  WBC 5.4  RBC 3.80*  HGB 12.3  HCT 38.6  MCV 101.6*  MCH 32.4  MCHC 31.9  RDW 13.9  PLT 160    BNP Recent Labs  Lab 09/06/20 1326  BNP 1,461.6*     DDimer No results for input(s): DDIMER in the last 168 hours.   Radiology    No results found.  Cardiac Studies     Patient Profile     87 y.o. female with a hx of dementia who presents with acute combined systolic and diastolic heart failure and atrial fibrillation with RVR.  Assessment & Plan    Acute combined systolic and diastolic heart failure: Presented with shortness of breath.  BNP 1461, chest x-ray with bilateral pleural effusions.  Echocardiogram shows LVEF 35 to 40%, global hypokinesis, mild RV dysfunction, mild MR.  New diagnosis, could be tachycardia induced given also with A. fib with RVR to 150s -Not a good candidate for ischemia evaluation given age and comorbidities, including dementia.  Plan medical management.  This was discussed with her daughter and she is in agreement with this -Transition to p.o. Lasix 20 mg daily -Continue metoprolol 25 mg twice daily.  Started on Entresto 24- 26 mg twice daily but hypotensive yesterday, holding Entresto.  Can plan to follow-up as an outpatient and add likely low-dose losartan if BP tolerates  Paroxysmal A. fib with RVR, new: found to be in A fib with RVR 150s on 09/07/20 morning EKG. chest event score 5 given CHF, hypertension, age x45,female -Risk and benefits of anticoagulation were discussed with daughter, agreeable with starting Eliquis 2.5 mg twice daily, reduced dose given age greater than 80 and weight less than 60 kg -Continue metoprolol 25 mg twice daily   For questions or updates,  please contact [facility] Please consult [email or web address] for contact info under        Signed, Little Ishikawa, MD  09/10/2020, 10:45 AM

## 2020-09-11 DIAGNOSIS — E43 Unspecified severe protein-calorie malnutrition: Secondary | ICD-10-CM | POA: Insufficient documentation

## 2020-09-11 MED ORDER — ENSURE ENLIVE PO LIQD: 237.0000 mL | ORAL | 0 refills | Status: AC

## 2020-09-11 MED ORDER — FUROSEMIDE 20 MG PO TABS: 20.0000 mg | ORAL_TABLET | Freq: Every day | ORAL | 0 refills | Status: DC

## 2020-09-11 MED ORDER — ADULT MULTIVITAMIN W/MINERALS CH: 1.0000 | ORAL_TABLET | Freq: Every day | ORAL | 0 refills | Status: AC

## 2020-09-11 MED ORDER — ENSURE ENLIVE PO LIQD: 237.0000 mL | ORAL | Status: DC

## 2020-09-11 MED ORDER — APIXABAN 2.5 MG PO TABS: 2.5000 mg | ORAL_TABLET | Freq: Two times a day (BID) | ORAL | 0 refills | Status: DC

## 2020-09-11 MED ORDER — METOPROLOL SUCCINATE ER 50 MG PO TB24: 50.0000 mg | ORAL_TABLET | Freq: Every day | ORAL | 0 refills | Status: DC

## 2020-09-11 MED ORDER — ADULT MULTIVITAMIN W/MINERALS CH: 1.0000 | ORAL_TABLET | Freq: Every day | ORAL | Status: DC

## 2020-09-11 NOTE — Progress Notes (Signed)
Progress Note  Patient Name: Kathleen [name] Date of Encounter: 09/11/2020  [facility] Cardiologist: None   Subjective   Sitting in chair pleasantly demented   Inpatient Medications    Scheduled Meds: . apixaban  2.5 mg Oral BID  . furosemide  20 mg Oral Daily  . metoprolol succinate  50 mg Oral Daily   Continuous Infusions:  PRN Meds: acetaminophen **OR** acetaminophen, metoprolol tartrate, ondansetron **OR** ondansetron (ZOFRAN) IV, senna-docusate   Vital Signs    Vitals:   09/10/20 2000 09/10/20 2113 09/11/20 0500 09/11/20 0625  BP:  (!) 127/94  130/77  Pulse:  84  81  Resp: (!) 23 14  14   Temp:  98.6 F (37 C)  97.9 F (36.6 C)  TempSrc:  Oral  Oral  SpO2:  95%  91%  Weight:   42.6 kg   Height:        Intake/Output Summary (Last 24 hours) at 09/11/2020 0910 Last data filed at 09/11/2020 0600 Gross per 24 hour  Intake 440 ml  Output 1950 ml  Net -1510 ml   Last 3 Weights 09/11/2020 09/10/2020 09/08/2020  Weight (lbs) 93 lb 14.7 oz 96 lb 12.5 oz 98 lb 1.7 oz  Weight (kg) 42.6 kg 43.9 kg 44.5 kg      Telemetry    Normal sinus rhythm in 70-110s, frequent PVCs- Personally Reviewed  ECG    No new ECG- Personally Reviewed  Physical Exam   Poor dentition Lungs clear No murmur  Abdomen soft  No edema   Labs    High Sensitivity Troponin:   Recent Labs  Lab 09/06/20 1326 09/06/20 1628  TROPONINIHS 16 18*      Chemistry Recent Labs  Lab 09/06/20 1326 09/07/20 0330 09/08/20 0818 09/10/20 0743  NA 141 143 142 141  K 4.3 3.8 4.1 4.0  CL 109 108 106 104  CO2 24 28 30 29   GLUCOSE 94 92 128* 96  BUN 17 16 23 17   CREATININE 0.92 0.95 0.96 0.93  CALCIUM 9.1 8.8* 9.5 9.2  PROT 6.0*  --   --   --   ALBUMIN 3.8  --   --   --   AST 32  --   --   --   ALT 30  --   --   --   ALKPHOS 70  --   --   --   BILITOT 0.5  --   --   --   GFRNONAA 60* 58* 57* 59*  ANIONGAP 8 7 6 8      Hematology Recent Labs  Lab 09/06/20 1326  WBC 5.4  RBC 3.80*   HGB 12.3  HCT 38.6  MCV 101.6*  MCH 32.4  MCHC 31.9  RDW 13.9  PLT 160    BNP Recent Labs  Lab 09/06/20 1326  BNP 1,461.6*     DDimer No results for input(s): DDIMER in the last 168 hours.   Radiology    No results found.  Cardiac Studies     Patient Profile     87 y.o. female with a hx of dementia who presents with acute combined systolic and diastolic heart failure and atrial fibrillation with RVR.  Assessment & Plan    Acute combined systolic and diastolic heart failure: Euvolemic EF 35-40% by TTE Given advanced dementia and DNR No invasive w/u planned. Daughter who has been living with her last 8 months agrees. Hypotensive with entresto will Change to PO lasix and low dose ARB Can  titrate meds as outpatient   Paroxysmal A. fib with RVR, new: currently NSR with rare PVC on low dose DOAC due to age and low weight continue  Beta blocker    For questions or updates, please contact [facility] Please consult [email or web address] for contact info under        Signed, Charlton Haws, MD  09/11/2020, 9:10 AM

## 2020-09-11 NOTE — Care Management Important Message (Signed)
Important Message  Patient Details IM Letter given to the Patient. Name: Kathleen [name] MRN: [id number] Date of Birth: [birth year]   Medicare Important Message Given:  Yes     Caren Macadam 09/11/2020, 12:51 PM

## 2020-09-11 NOTE — Progress Notes (Signed)
Initial Nutrition Assessment  DOCUMENTATION CODES:   Severe malnutrition in context of chronic illness,Underweight  INTERVENTION:  - will liberalize diet from Heart Healthy to 2 gram Na. - will order Ensure Enlive once/day, each supplement provides 350 kcal and 20 grams of protein. - will order 1 tablet multivitamin with minerals/day.    NUTRITION DIAGNOSIS:   Severe Malnutrition related to chronic illness (cognitive impairment) as evidenced by severe fat depletion,severe muscle depletion  GOAL:   Patient will meet greater than or equal to 90% of their needs  MONITOR:   PO intake,Supplement acceptance,Labs,Weight trends  REASON FOR ASSESSMENT:   Other (Comment) (underweight BMI)  ASSESSMENT:   87 y.o. female with medical history of cognitive impairment and hearing impairment. She presented to the ED d/t one week hx of increased SOB when laying flat resulting in need to sleep in a recliner. She was admitted d/t concern for new onset CHF with exacerbation.  She has been eating 50-100% over the past 3-4 days. Patient noted to be confused. She was sitting in the chair at the time of visit and does interact with RD, but does not provide any information.   Her daughter was at bedside. Discussed current medical dx, diet order, and recommendations for home. Encouraged not adding salt to foods but otherwise not limiting which foods patient consumes. She likes low salt chips and low salt popcorn. She is a picky eater and eats small portions every few hours. Encouraged daughter to continue to provide patient with foods she enjoys and is willing to accept. Patient does not like spices/seasonings/herbs.   Weight today is 94 lb and weight on 6/1 was 104 lb. PTA the most recently documented weight was 101 lb on 07/20/20.    Labs reviewed; Medications reviewed; 20 mg oral lasix/day.    NUTRITION - FOCUSED PHYSICAL EXAM:  Flowsheet Row Most Recent Value  Orbital Region Moderate depletion   Upper Arm Region Severe depletion  Thoracic and Lumbar Region Severe depletion  Buccal Region Severe depletion  Temple Region Severe depletion  Clavicle Bone Region Severe depletion  Clavicle and Acromion Bone Region Severe depletion  Scapular Bone Region Unable to assess  Dorsal Hand Severe depletion  Patellar Region Severe depletion  Anterior Thigh Region Unable to assess  Posterior Calf Region Severe depletion  Edema (RD Assessment) None  Hair Reviewed  Eyes Reviewed  Mouth Reviewed  Skin Reviewed  Nails Reviewed       Diet Order:   Diet Order            Diet 2 gram sodium Room service appropriate? Yes; Fluid consistency: Thin  Diet effective now           Diet - low sodium heart healthy                 EDUCATION NEEDS:   Education needs have been addressed  Skin:  Skin Assessment: Reviewed RN Assessment  Last BM:  6/4  Height:   Ht Readings from Last 1 Encounters:  09/06/20 5\' 4"  (1.626 m)    Weight:   Wt Readings from Last 1 Encounters:  09/11/20 42.6 kg     Estimated Nutritional Needs:  Kcal:  1200-1450 kcal Protein:  60-70 grams Fluid:  >/= 1.2 L/day     11/11/20, MS, RD, LDN, CNSC Inpatient Clinical Dietitian RD pager # available in AMION  After hours/weekend pager # available in [facility]

## 2020-09-11 NOTE — Plan of Care (Signed)
Discharge education reviewed with pts daughter at bedside. Pt to be discharge home with daughter. All pt belongings collected and taken with pt at discharge. Pt safety maintained.  Problem: Education: Goal: Ability to demonstrate management of disease process will improve Outcome: Adequate for Discharge Goal: Ability to verbalize understanding of medication therapies will improve Outcome: Adequate for Discharge Goal: Individualized Educational Video(s) Outcome: Adequate for Discharge   Problem: Activity: Goal: Capacity to carry out activities will improve Outcome: Adequate for Discharge   Problem: Cardiac: Goal: Ability to achieve and maintain adequate cardiopulmonary perfusion will improve Outcome: Adequate for Discharge   Problem: Education: Goal: Knowledge of General Education information will improve Description: Including pain rating scale, medication(s)/side effects and non-pharmacologic comfort measures Outcome: Adequate for Discharge   Problem: Health Behavior/Discharge Planning: Goal: Ability to manage health-related needs will improve Outcome: Adequate for Discharge   Problem: Clinical Measurements: Goal: Ability to maintain clinical measurements within normal limits will improve Outcome: Adequate for Discharge Goal: Will remain free from infection Outcome: Adequate for Discharge Goal: Diagnostic test results will improve Outcome: Adequate for Discharge Goal: Respiratory complications will improve Outcome: Adequate for Discharge Goal: Cardiovascular complication will be avoided Outcome: Adequate for Discharge   Problem: Activity: Goal: Risk for activity intolerance will decrease Outcome: Adequate for Discharge   Problem: Nutrition: Goal: Adequate nutrition will be maintained Outcome: Adequate for Discharge   Problem: Coping: Goal: Level of anxiety will decrease Outcome: Adequate for Discharge   Problem: Elimination: Goal: Will not experience complications  related to bowel motility Outcome: Adequate for Discharge Goal: Will not experience complications related to urinary retention Outcome: Adequate for Discharge   Problem: Pain Managment: Goal: General experience of comfort will improve Outcome: Adequate for Discharge   Problem: Safety: Goal: Ability to remain free from injury will improve Outcome: Adequate for Discharge   Problem: Skin Integrity: Goal: Risk for impaired skin integrity will decrease Outcome: Adequate for Discharge

## 2020-09-12 DIAGNOSIS — I480 Paroxysmal atrial fibrillation: Secondary | ICD-10-CM | POA: Diagnosis not present

## 2020-09-12 DIAGNOSIS — H9193 Unspecified hearing loss, bilateral: Secondary | ICD-10-CM | POA: Diagnosis not present

## 2020-09-12 DIAGNOSIS — J9601 Acute respiratory failure with hypoxia: Secondary | ICD-10-CM | POA: Diagnosis not present

## 2020-09-12 DIAGNOSIS — F0390 Unspecified dementia without behavioral disturbance: Secondary | ICD-10-CM | POA: Diagnosis not present

## 2020-09-12 DIAGNOSIS — Z7901 Long term (current) use of anticoagulants: Secondary | ICD-10-CM | POA: Diagnosis not present

## 2020-09-12 DIAGNOSIS — I110 Hypertensive heart disease with heart failure: Secondary | ICD-10-CM | POA: Diagnosis not present

## 2020-09-12 DIAGNOSIS — Z9181 History of falling: Secondary | ICD-10-CM | POA: Diagnosis not present

## 2020-09-12 DIAGNOSIS — M810 Age-related osteoporosis without current pathological fracture: Secondary | ICD-10-CM | POA: Diagnosis not present

## 2020-09-12 DIAGNOSIS — E43 Unspecified severe protein-calorie malnutrition: Secondary | ICD-10-CM | POA: Diagnosis not present

## 2020-09-12 DIAGNOSIS — I509 Heart failure, unspecified: Secondary | ICD-10-CM | POA: Diagnosis not present

## 2020-09-13 NOTE — Discharge Summary (Signed)
[facility] Discharge Summary   Patient: Kathleen [name] DQQ:[id number]  PCP: Jarrett Soho, PA-C  Date of admission: 09/06/2020   Date of discharge: 09/11/2020     Discharge Diagnoses:  Principal Problem:   Acute CHF (congestive heart failure) (HCC) Active Problems:   CHF (congestive heart failure) (HCC)   Acute respiratory failure with hypoxia (HCC)   HTN (hypertension)   Protein-calorie malnutrition, severe   Admitted From: home Disposition:  Home   Recommendations for Outpatient Follow-up:  1. PCP: please follow up with pcp in 1 week 2. Follow up LABS/TEST:  none   Follow-up Information    Jarrett Soho, PA-C. Schedule an appointment as soon as possible for a visit in 1 week(s).   Specialty: Family Medicine Contact information: [address] [place] [address] [phone]        [facility] [address] Office Follow up.   Specialty: Cardiology Why: offica will call you for follow up.  Contact information: [address] [place] [address] [phone]             Discharge Instructions    Diet - low sodium heart healthy   Complete by: As directed    Increase activity slowly   Complete by: As directed       Diet recommendation: Cardiac diet  Activity: The patient is advised to gradually reintroduce usual activities, as tolerated  Discharge Condition: stable  Code Status: DNR   History of present illness: As per the H and P dictated on admission, " Kathleen [name] is a 87 y.o. female with medical history significant for cognitive impairment, hearing impairment who presents with concerns of new onset CHF exacerbation.   Daughter at bedside provides most of history since patient unable to recall her symptoms.  About a week ago patient began to complain of increasing shortness of breath with laying flat.  The symptoms would improve with sleeping with more pillows and she eventually had to sleep on the  recliner.  Denies any chest pain or palpitation symptoms. No fever or cough.  No lower extremity edema.  Denies any tobacco, alcohol or illicit drug use. Daughter has tried to take blood pressure at home but does not think it has been actual since it fluctuates from systolic of 160 to 130s at times.   ED Course: She was noted to be mildly hypertensive with systolic in the 160s requiring 2 L of O2, BNP of 1461, troponin of 16 and then 18.  EKG shows frequent PVCs but no ST or T wave abnormalities.  Chest x-ray shows bilateral pleural effusion.  CBC and CMP unremarkable.  She received 20 mg of IV Lasix in the ED with over 1 L of urine output."  Hospital Course:  Summary of her active problems in the hospital is as following.   1.  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 fib with RVR. Echocardiogram completed EF is 35%. Given IV Lasix. No further ischemic work-up in the hospital. Currently on Toprol-XL and Lasix. Unable to tolerate Entresto.   2.  Acute hypoxic respiratory failure, POA Currently able to come off of the oxygen. Monitor.  3.  Paroxysmal A. fib with RVR Patient was given 1 dose of IV Cardizem without any improvement. Currently on Cardizem drip. For therapeutic anticoagulation discussed with patient and daughter in detail.  Patient does not have any active recurrent fall or bleeding history. [place] Vascor is 5. Now  on Eliquis.  4.  Essential hypertension Currently on Lopressor and Lasix.  Discontinue lisinopril. Switching Lopressor to Toprol-XL. Unable to tolerate Entresto.  5.  Underweight Severe malnutrition Placing the patient at high risk of poor outcome. Monitor. Body mass index is 16.61 kg/m.   6.  Dementia. Intermittently confused. Currently no focal deficit. Monitor.  Body mass index is 16.12 kg/m.  Nutrition Problem: Severe Malnutrition Etiology: chronic illness  (cognitive impairment) Nutrition Interventions: Interventions: Liberalize Diet,Ensure Enlive (each supplement provides 350kcal and 20 grams of protein),MVI       Patient was seen by physical therapy, who recommended Home Health. On the day of the discharge the patient's vitals were stable, and no other new acute medical condition were reported. The patient was felt safe to be discharge at Home with Home health.  Consultants: Cardiology  Procedures: Echocardiogram   DISCHARGE MEDICATION: Allergies as of 09/11/2020   No Known Allergies     Medication List    TAKE these medications   apixaban 2.5 MG Tabs tablet Commonly known as: ELIQUIS Take 1 tablet (2.5 mg total) by mouth 2 (two) times daily.   feeding supplement Liqd Take 237 mLs by mouth daily.   furosemide 20 MG tablet Commonly known as: LASIX Take 1 tablet (20 mg total) by mouth daily.   metoprolol succinate 50 MG 24 hr tablet Commonly known as: TOPROL-XL Take 1 tablet (50 mg total) by mouth daily. Take with or immediately following a meal.   multivitamin with minerals Tabs tablet Take 1 tablet by mouth daily.       Discharge Exam: Filed Weights   09/08/20 0500 09/10/20 0500 09/11/20 0500  Weight: 44.5 kg 43.9 kg 42.6 kg   Vitals:   09/10/20 2113 09/11/20 0625  BP: (!) 127/94 130/77  Pulse: 84 81  Resp: 14 14  Temp: 98.6 F (37 C) 97.9 F (36.6 C)  SpO2: 95% 91%   General: Appear in mild distress, no Rash; Oral Mucosa Clear, moist. no Abnormal Neck Mass Or lumps, Conjunctiva normal  Cardiovascular: S1 and S2 Present, no Murmur, Respiratory: good respiratory effort, Bilateral Air entry present and CTA, no Crackles, no wheezes Abdomen: Bowel Sound present, Soft and no tenderness Extremities: no Pedal edema Neurology: alert and oriented to time, place, and person affect appropriate. no new focal deficit Gait not checked due to patient safety concerns    The results of significant diagnostics from this  hospitalization (including imaging, microbiology, ancillary and laboratory) are listed below for reference.    Significant Diagnostic Studies: DG Chest 2 View  Result Date: 09/06/2020 CLINICAL DATA:  Shortness of breath EXAM: CHEST - 2 VIEW COMPARISON:  None. FINDINGS: There are pleural effusions bilaterally with patchy airspace opacity in the lower lung regions bilaterally. Heart size and pulmonary vascularity are normal. No adenopathy. There is aortic atherosclerosis. There is severe anterior wedging of the T12 vertebral body. There is an old healed fracture of the proximal left humerus. Bones are osteoporotic. IMPRESSION: Pleural effusions bilaterally. Suspect bibasilar pneumonia superimposed. Heart size and pulmonary vascularity are normal. Osteoporosis with prior fractures of the proximal left humerus and T12 vertebral body. Aortic Atherosclerosis (ICD10-I70.0). Electronically Signed   By: Bretta Bang III M.D.   On: 09/06/2020 14:12   ECHOCARDIOGRAM COMPLETE  Result Date: 09/07/2020    ECHOCARDIOGRAM REPORT   Patient Name:   Kathleen [name] Date of Exam: 09/07/2020 Medical Rec #:  [id number]    Height:       64.0 in Accession #:  [id number]   Weight:       104.4 lb Date of Birth:  23-Jan-1930    BSA:          1.484 m Patient Age:    87 years     BP:           91/66 mmHg Patient Gender: F            HR:           86 bpm. Exam Location:  Inpatient Procedure: 2D Echo, 3D Echo, Cardiac Doppler and Color Doppler Indications:    I50.40* Unspecified combined systolic (congestive) and diastolic                 (congestive) heart failure  History:        Patient has no prior history of Echocardiogram examinations.                 CHF, Signs/Symptoms:Dyspnea, Shortness of Breath, Altered Mental                 Status and Alzheimer's; Risk Factors:Hypertension. Hypoxia.  Sonographer:    Sheralyn Boatman RDCS Referring Phys: [id number] CHING T TU  Sonographer Comments: Technically difficult study due to poor echo windows.  IMPRESSIONS  1. Left ventricular ejection fraction, by estimation, is 35 to 40%. The left ventricle has moderately decreased function. The left ventricle demonstrates global hypokinesis. There is mild concentric left ventricular hypertrophy. Left ventricular diastolic parameters are consistent with Grade I diastolic dysfunction (impaired relaxation). Elevated left ventricular end-diastolic pressure.  2. Right ventricular systolic function is mildly reduced. The right ventricular size is normal. There is normal pulmonary artery systolic pressure. The estimated right ventricular systolic pressure is 32.4 mmHg.  3. The mitral valve is normal in structure. Mild mitral valve regurgitation. No evidence of mitral stenosis.  4. The aortic valve is normal in structure. Aortic valve regurgitation is not visualized. No aortic stenosis is present.  5. The inferior vena cava is normal in size with greater than 50% respiratory variability, suggesting right atrial pressure of 3 mmHg. FINDINGS  Left Ventricle: Left ventricular ejection fraction, by estimation, is 35 to 40%. The left ventricle has moderately decreased function. The left ventricle demonstrates global hypokinesis. The left ventricular internal cavity size was normal in size. There is mild concentric left ventricular hypertrophy. Left ventricular diastolic parameters are consistent with Grade I diastolic dysfunction (impaired relaxation). Elevated left ventricular end-diastolic pressure. Right Ventricle: The right ventricular size is normal. No increase in right ventricular wall thickness. Right ventricular systolic function is mildly reduced. There is normal pulmonary artery systolic pressure. The tricuspid regurgitant velocity is 2.71 m/s, and with an assumed right atrial pressure of 3 mmHg, the estimated right ventricular systolic pressure is 32.4 mmHg. Left Atrium: Left atrial size was normal in size. Right Atrium: Right atrial size was normal in size. Pericardium:  There is no evidence of pericardial effusion. Mitral Valve: The mitral valve is normal in structure. Mild mitral valve regurgitation. No evidence of mitral valve stenosis. Tricuspid Valve: The tricuspid valve is normal in structure. Tricuspid valve regurgitation is mild . No evidence of tricuspid stenosis. Aortic Valve: The aortic valve is normal in structure. Aortic valve regurgitation is not visualized. No aortic stenosis is present. Pulmonic Valve: The pulmonic valve was normal in structure. Pulmonic valve regurgitation is not visualized. No evidence of pulmonic stenosis. Aorta: The aortic root is normal in size and structure. Venous: The inferior vena cava is normal in size  with greater than 50% respiratory variability, suggesting right atrial pressure of 3 mmHg. IAS/Shunts: No atrial level shunt detected by color flow Doppler.  LEFT VENTRICLE PLAX 2D LVIDd:         4.40 cm     Diastology LVIDs:         3.80 cm     LV e' medial:    2.68 cm/s LV PW:         1.20 cm     LV E/e' medial:  25.5 LV IVS:        1.10 cm     LV e' lateral:   3.99 cm/s LVOT diam:     1.90 cm     LV E/e' lateral: 17.1 LV SV:         53 LV SV Index:   36 LVOT Area:     2.84 cm  LV Volumes (MOD) LV vol d, MOD A2C: 57.3 ml LV vol d, MOD A4C: 58.0 ml LV vol s, MOD A2C: 45.6 ml LV vol s, MOD A4C: 35.4 ml LV SV MOD A2C:     11.7 ml LV SV MOD A4C:     58.0 ml LV SV MOD BP:      18.8 ml RIGHT VENTRICLE            IVC RV S prime:     8.81 cm/s  IVC diam: 1.70 cm TAPSE (M-mode): 1.6 cm LEFT ATRIUM             Index       RIGHT ATRIUM           Index LA diam:        3.95 cm 2.66 cm/m  RA Area:     11.70 cm LA Vol (A2C):   54.6 ml 36.79 ml/m RA Volume:   25.50 ml  17.18 ml/m LA Vol (A4C):   33.8 ml 22.77 ml/m LA Biplane Vol: 46.7 ml 31.47 ml/m  AORTIC VALVE LVOT Vmax:   103.00 cm/s LVOT Vmean:  66.300 cm/s LVOT VTI:    0.186 m  AORTA Ao Root diam: 2.90 cm MITRAL VALVE                TRICUSPID VALVE MV Area (PHT): 4.15 cm     TR Peak grad:    29.4 mmHg MV Decel Time: 183 msec     TR Vmax:        271.00 cm/s MV E velocity: 68.40 cm/s MV A velocity: 105.00 cm/s  SHUNTS MV E/A ratio:  0.65         Systemic VTI:  0.19 m                             Systemic Diam: 1.90 cm Armanda Magic MD Electronically signed by Armanda Magic MD Signature Date/Time: 09/07/2020/4:39:55 PM    Final     Microbiology: Recent Results (from the past 240 hour(s))  Resp Panel by RT-PCR (Flu A&B, Covid) Nasopharyngeal Swab     Status: None   Collection Time: 09/06/20  1:26 PM   Specimen: Nasopharyngeal Swab; Nasopharyngeal(NP) swabs in vial transport medium  Result Value Ref Range Status   SARS Coronavirus 2 by RT PCR NEGATIVE NEGATIVE Final    Comment: (NOTE) SARS-CoV-2 target nucleic acids are NOT DETECTED.  The SARS-CoV-2 RNA is generally detectable in upper respiratory specimens during the acute phase of infection. The lowest concentration of SARS-CoV-2 viral copies this  assay can detect is 138 copies/mL. A negative result does not preclude SARS-Cov-2 infection and should not be used as the sole basis for treatment or other patient management decisions. A negative result may occur with  improper specimen collection/handling, submission of specimen other than nasopharyngeal swab, presence of viral mutation(s) within the areas targeted by this assay, and inadequate number of viral copies(<138 copies/mL). A negative result must be combined with clinical observations, patient history, and epidemiological information. The expected result is Negative.  Fact Sheet for Patients:  BloggerCourse.comhttps://www.fda.gov/media/152166/download  Fact Sheet for Healthcare Providers:  SeriousBroker.ithttps://www.fda.gov/media/152162/download  This test is no t yet approved or cleared by the [place] FDA and  has been authorized for detection and/or diagnosis of SARS-CoV-2 by FDA under an Emergency Use Authorization (EUA). This EUA will remain  in effect (meaning this test can be used) for the  duration of the COVID-19 declaration under Section 564(b)(1) of the Act, 21 U.S.C.section 360bbb-3(b)(1), unless the authorization is terminated  or revoked sooner.       Influenza A by PCR NEGATIVE NEGATIVE Final   Influenza B by PCR NEGATIVE NEGATIVE Final    Comment: (NOTE) The Xpert Xpress SARS-CoV-2/FLU/RSV plus assay is intended as an aid in the diagnosis of influenza from Nasopharyngeal swab specimens and should not be used as a sole basis for treatment. Nasal washings and aspirates are unacceptable for Xpert Xpress SARS-CoV-2/FLU/RSV testing.  Fact Sheet for Patients: BloggerCourse.comhttps://www.fda.gov/media/152166/download  Fact Sheet for Healthcare Providers: SeriousBroker.ithttps://www.fda.gov/media/152162/download  This test is not yet approved or cleared by the [place] FDA and has been authorized for detection and/or diagnosis of SARS-CoV-2 by FDA under an Emergency Use Authorization (EUA). This EUA will remain in effect (meaning this test can be used) for the duration of the COVID-19 declaration under Section 564(b)(1) of the Act, 21 U.S.C. section 360bbb-3(b)(1), unless the authorization is terminated or revoked.  Performed at [facility], [address], [place], [place] [id number]      Labs: CBC: Recent Labs  Lab 09/06/20 1326  WBC 5.4  NEUTROABS 3.7  HGB 12.3  HCT 38.6  MCV 101.6*  PLT 160   Basic Metabolic Panel: Recent Labs  Lab 09/06/20 1326 09/07/20 0330 09/08/20 0818 09/10/20 0743  NA 141 143 142 141  K 4.3 3.8 4.1 4.0  CL 109 108 106 104  CO2 24 28 30 29   GLUCOSE 94 92 128* 96  BUN 17 16 23 17   CREATININE 0.92 0.95 0.96 0.93  CALCIUM 9.1 8.8* 9.5 9.2  MG  --   --  2.0 2.3   Liver Function Tests: Recent Labs  Lab 09/06/20 1326  AST 32  ALT 30  ALKPHOS 70  BILITOT 0.5  PROT 6.0*  ALBUMIN 3.8   CBG: No results for input(s): GLUCAP in the last 168 hours.  Time spent: 35 minutes  Signed:  Lynden Oxfordranav Corinda Ammon  [facility] 09/11/2020

## 2020-09-15 DIAGNOSIS — I509 Heart failure, unspecified: Secondary | ICD-10-CM | POA: Diagnosis not present

## 2020-09-15 DIAGNOSIS — I700 Atherosclerosis of aorta: Secondary | ICD-10-CM | POA: Diagnosis not present

## 2020-09-15 DIAGNOSIS — M8000XD Age-related osteoporosis with current pathological fracture, unspecified site, subsequent encounter for fracture with routine healing: Secondary | ICD-10-CM | POA: Diagnosis not present

## 2020-09-20 ENCOUNTER — Telehealth: Payer: Self-pay

## 2020-09-20 NOTE — Telephone Encounter (Signed)
Spoke with patient's daughter April and scheduled an in-person Palliative Consult for 10/24/20 @ 12:30PM.  COVID screening was negative. No pets in home. Patient's daughter lives with her.   Consent obtained; updated Outlook/Netsmart/Team List and Epic.   Family is aware they may be receiving a call from NP the day before or day of to confirm appointment.

## 2020-09-25 DIAGNOSIS — I4891 Unspecified atrial fibrillation: Secondary | ICD-10-CM | POA: Insufficient documentation

## 2020-09-25 NOTE — Progress Notes (Signed)
Cardiology Office Note   Date:  09/26/2020   ID:  Kathleen [name], DOB 1929/05/07, MRN [id number]  PCP:  Jarrett Soho, PA-C  Cardiologist:   None   Chief Complaint  Patient presents with   Atrial Fibrillation       History of Present Illness: Kathleen [name] is a 87 y.o. female who presents for follow up of a recent admission for CHF earlier this month.   She has dementia.  She was admitted with volume overload.  She had an EF of 35%.  She did not tolerate Entresto and was treated with Lasix and Toprol.  She was in atrial fib and started on Eliquis.      Since going home she is actually done quite well.  Again her dementia limits history but she does not report any palpitations, presyncope or syncope.  She has had no swelling or shortness of breath.  She is doing much better than prior to the hospital.  There is no PND or orthopnea.  Her daughter takes care of her.  She has been fatigued.  Past Medical History:  Diagnosis Date   Atrial fibrillation (HCC)    Cardiomyopathy (HCC)    Dementia (HCC)     History reviewed. No pertinent surgical history.   Current Outpatient Medications  Medication Sig Dispense Refill   apixaban (ELIQUIS) 2.5 MG TABS tablet Take 1 tablet (2.5 mg total) by mouth 2 (two) times daily. 60 tablet 0   feeding supplement (ENSURE ENLIVE / ENSURE PLUS) LIQD Take 237 mLs by mouth daily. 3000 mL 0   furosemide (LASIX) 20 MG tablet Take 1 tablet (20 mg total) by mouth daily. 30 tablet 0   metoprolol succinate (TOPROL-XL) 50 MG 24 hr tablet Take 1 tablet (50 mg total) by mouth daily. Take with or immediately following a meal. 30 tablet 0   Multiple Vitamin (MULTIVITAMIN WITH MINERALS) TABS tablet Take 1 tablet by mouth daily. 30 tablet 0   No current facility-administered medications for this visit.    Allergies:   Patient has no known allergies.    ROS:  Please see the history of present illness.   Otherwise, review of systems are positive for none.    All other systems are reviewed and negative.    PHYSICAL EXAM: VS:  BP 112/78 (BP Location: Left Arm, Patient Position: Sitting, Cuff Size: Normal)   Pulse 77   Resp 18   Ht 5\' 2"  (1.575 m)   Wt 99 lb (44.9 kg)   SpO2 99%   BMI 18.11 kg/m  , BMI Body mass index is 18.11 kg/m. GENERAL:  Well appearing NECK:  No jugular venous distention, waveform within normal limits, carotid upstroke brisk and symmetric, no bruits, no thyromegaly LUNGS:  Clear to auscultation bilaterally CHEST:  Unremarkable HEART:  PMI not displaced or sustained,S1 and S2 within normal limits, no S3, no S4, no clicks, no rubs, no murmurs ABD:  Flat, positive bowel sounds normal in frequency in pitch, no bruits, no rebound, no guarding, no midline pulsatile mass, no hepatomegaly, no splenomegaly EXT:  2 plus pulses throughout, trace ankle edema, no cyanosis no clubbing     EKG:  EKG is ordered today. The ekg ordered today demonstrates sinus rhythm, rate 80, axis within normal limits, intervals within normal limits, premature ventricular contractions, no acute ST-T wave changes.     Recent Labs: 09/06/2020: ALT 30; B Natriuretic Peptide 1,461.6; Hemoglobin 12.3; Platelets 160 09/10/2020: BUN 17; Creatinine, Ser 0.93; Magnesium 2.3; Potassium  4.0; Sodium 141    Lipid Panel No results found for: CHOL, TRIG, HDL, CHOLHDL, VLDL, LDLCALC, LDLDIRECT    Wt Readings from Last 3 Encounters:  09/26/20 99 lb (44.9 kg)  09/11/20 93 lb 14.7 oz (42.6 kg)  07/20/20 101 lb (45.8 kg)      Other studies Reviewed: Additional studies/ records that were reviewed today include: Hospital records. Review of the above records demonstrates:  Please see elsewhere in the note.     ASSESSMENT AND PLAN:  Acute systolic HF:    It was decided not to pursue ischemia evaluation secondary to her age an frailty.  She did not tolerate up titration of meds.  No change in therapy.  We talked about dosing of her diuretic.  Atrial fib with  RVR: She is back in sinus rhythm today.  She really does not feel this.[name] has a CHA2DS2 - VASc score of 4.  She tolerates anticoagulation.  No change in therapy.   Current medicines are reviewed at length with the patient today.  The patient does not have concerns regarding medicines.  The following changes have been made:  no change  Labs/ tests ordered today include:   Orders Placed This Encounter  Procedures   Basic metabolic panel   EKG 12-Lead      Disposition:   FU with APP in six months.     Signed, Rollene Rotunda, MD  09/26/2020 5:09 PM    [facility]

## 2020-09-26 ENCOUNTER — Encounter: Payer: Self-pay | Admitting: Cardiology

## 2020-09-26 ENCOUNTER — Ambulatory Visit: Payer: Medicare Other | Admitting: Cardiology

## 2020-09-26 ENCOUNTER — Other Ambulatory Visit: Payer: Self-pay

## 2020-09-26 VITALS — BP 112/78 | HR 77 | Resp 18 | Ht 62.0 in | Wt 99.0 lb

## 2020-09-26 DIAGNOSIS — I5021 Acute systolic (congestive) heart failure: Secondary | ICD-10-CM | POA: Diagnosis not present

## 2020-09-26 DIAGNOSIS — I4891 Unspecified atrial fibrillation: Secondary | ICD-10-CM

## 2020-09-26 NOTE — Patient Instructions (Signed)
Medication Instructions:  Your physician recommends that you continue on your current medications as directed. Please refer to the Current Medication list given to you today.  *If you need a refill on your cardiac medications before your next appointment, please call your pharmacy*   Lab Work: BMET to be drawn within 1 month.   If you have labs (blood work) drawn today and your tests are completely normal, you will receive your results only by: MyChart Message (if you have MyChart) OR A paper copy in the mail If you have any lab test that is abnormal or we need to change your treatment, we will call you to review the results.   Testing/Procedures: None ordered.    Follow-Up: At [facility], you and your health needs are our priority.  As part of our continuing mission to provide you with exceptional heart care, we have created designated Provider Care Teams.  These Care Teams include your primary Cardiologist (physician) and Advanced Practice Providers (APPs -  Physician Assistants and Nurse Practitioners) who all work together to provide you with the care you need, when you need it.  We recommend signing up for the patient portal called "MyChart".  Sign up information is provided on this After Visit Summary.  MyChart is used to connect with patients for Virtual Visits (Telemedicine).  Patients are able to view lab/test results, encounter notes, upcoming appointments, etc.  Non-urgent messages can be sent to your provider as well.   To learn more about what you can do with MyChart, go to [email or web address].    Your next appointment:   6 month(s)  The format for your next appointment:   In Person  Provider:   You will see one of the following Advanced Practice Providers on your designated Care Team:   Theodore Demark, PA-C Joni Reining, DNP, ANP

## 2020-10-06 ENCOUNTER — Other Ambulatory Visit: Payer: Self-pay

## 2020-10-06 ENCOUNTER — Encounter: Payer: Self-pay | Admitting: Cardiology

## 2020-10-06 ENCOUNTER — Ambulatory Visit: Payer: Medicare Other | Admitting: Cardiology

## 2020-10-06 VITALS — BP 120/56 | HR 72 | Ht 65.0 in | Wt 101.6 lb

## 2020-10-06 DIAGNOSIS — I5021 Acute systolic (congestive) heart failure: Secondary | ICD-10-CM

## 2020-10-06 DIAGNOSIS — I4891 Unspecified atrial fibrillation: Secondary | ICD-10-CM

## 2020-10-06 MED ORDER — FUROSEMIDE 20 MG PO TABS: 20.0000 mg | ORAL_TABLET | Freq: Every day | ORAL | 3 refills | Status: DC

## 2020-10-06 MED ORDER — METOPROLOL SUCCINATE ER 50 MG PO TB24: 50.0000 mg | ORAL_TABLET | Freq: Every day | ORAL | 3 refills | Status: DC

## 2020-10-06 MED ORDER — APIXABAN 2.5 MG PO TABS: 2.5000 mg | ORAL_TABLET | Freq: Two times a day (BID) | ORAL | 2 refills | Status: DC

## 2020-10-06 NOTE — Progress Notes (Signed)
Cardiology Office Note   Date:  10/06/2020   ID:  Vesper Trant, DOB Jan 19, 1930, MRN [id number]  PCP:  Jarrett Soho, PA-C  Cardiologist:   None   Chief Complaint  Patient presents with   Shortness of Breath        History of Present Illness: Kathleen [name] is a 87 y.o. female who presents for follow up of a recent admission for CHF earlier this month.   She has dementia.  She was admitted with volume overload.  She had an EF of 35%.  She did not tolerate Entresto and was treated with Lasix and Toprol.  She was in atrial fib and started on Eliquis.      When I saw her recently she was doing well and she was back in sinus rhythm.  However, her daughter called because she was having mornings where she has been more short of breath.   She was added to my schedule for this today .  For instance this morning she was noticeably short of breath.  Her daughter does have a pulse oximeter now that her oxygen saturation was 90%.  He came back up through the course of the morning to the mid 90s.  Heart rate has been in the 70s and 80s.  The patient has severe dementia so does not recall the symptoms.  There is been no demonstrated orthopnea or PND.  There is been no description of chest discomfort by the patient.  Her weights have gone up a couple of pounds by our scales but seem to be steady at home.  She is not having any new edema.  She is not feeling her heart racing or skipping.  Walking around the office today her oxygen dipped only to 90% with ambulation.   Past Medical History:  Diagnosis Date   Atrial fibrillation (HCC)    Cardiomyopathy (HCC)    Dementia (HCC)     History reviewed. No pertinent surgical history.   Current Outpatient Medications  Medication Sig Dispense Refill   feeding supplement (ENSURE ENLIVE / ENSURE PLUS) LIQD Take 237 mLs by mouth daily. 3000 mL 0   Multiple Vitamin (MULTIVITAMIN WITH MINERALS) TABS tablet Take 1 tablet by mouth daily. 30 tablet 0    apixaban (ELIQUIS) 2.5 MG TABS tablet Take 1 tablet (2.5 mg total) by mouth 2 (two) times daily. 60 tablet 2   furosemide (LASIX) 20 MG tablet Take 1 tablet (20 mg total) by mouth daily. 90 tablet 3   metoprolol succinate (TOPROL-XL) 50 MG 24 hr tablet Take 1 tablet (50 mg total) by mouth daily. Take with or immediately following a meal. 90 tablet 3   No current facility-administered medications for this visit.    Allergies:   Patient has no known allergies.    ROS:  Please see the history of present illness.   Otherwise, review of systems are positive for none.   All other systems are reviewed and negative.    PHYSICAL EXAM: VS:  BP (!) 120/56 (BP Location: Left Arm)   Pulse 72   Ht 5\' 5"  (1.651 m)   Wt 101 lb 9.6 oz (46.1 kg)   SpO2 93%   BMI 16.91 kg/m  , BMI Body mass index is 16.91 kg/m. GENERAL:  Well appearing NECK:  No jugular venous distention, waveform within normal limits, carotid upstroke brisk and symmetric, no bruits, no thyromegaly LUNGS:  Clear to auscultation bilaterally CHEST:  Unremarkable HEART:  PMI not displaced or sustained,S1 and  S2 within normal limits, no S3, no S4, no clicks, no rubs, no murmurs ABD:  Flat, positive bowel sounds normal in frequency in pitch, no bruits, no rebound, no guarding, no midline pulsatile mass, no hepatomegaly, no splenomegaly EXT:  2 plus pulses throughout, no edema, no cyanosis no clubbing   EKG:  EKG is  ordered today. The ekg ordered today demonstrates sinus rhythm, rate 81, axis within normal limits, intervals within normal limits, premature ventricular contractions, no acute ST-T wave changes.     Recent Labs: 09/06/2020: ALT 30; B Natriuretic Peptide 1,461.6; Hemoglobin 12.3; Platelets 160 09/10/2020: BUN 17; Creatinine, Ser 0.93; Magnesium 2.3; Potassium 4.0; Sodium 141    Lipid Panel No results found for: CHOL, TRIG, HDL, CHOLHDL, VLDL, LDLCALC, LDLDIRECT    Wt Readings from Last 3 Encounters:  10/06/20 101 lb 9.6  oz (46.1 kg)  09/26/20 99 lb (44.9 kg)  09/11/20 93 lb 14.7 oz (42.6 kg)      Other studies Reviewed: Additional studies/ records that were reviewed today include: None Review of the above records demonstrates:  NA   ASSESSMENT AND PLAN:  Acute systolic HF:   I am going to increase her Lasix to 40 mg for 5 days and then go back to 20 mg dose.  Her oxygen saturation did not fall below 90% it was right at 90% with ambulation.  If however it is low at home and recorded by her nurse I might be able to use this to order home O2.  Atrial fib with RVR:   She is maintaining sinus rhythm.  She is back in sinus rhythm today.  She really does not feel this.Kathleen [name] has a CHA2DS2 - VASc score of 4.  She tolerates anticoagulation.  No change in therapy.   Current medicines are reviewed at length with the patient today.  The patient does not have concerns regarding medicines.  The following changes have been made: As above  Labs/ tests ordered today include:   No orders of the defined types were placed in this encounter.    Disposition:   FU with APP in 3 months.   Signed, Rollene Rotunda, MD  10/06/2020 5:06 PM    [facility]

## 2020-10-06 NOTE — Patient Instructions (Addendum)
Medication Instructions:  INCREASE Lasix to 40 mg daily for 5 days, then resume 20 mg daily on day 6 going forward  *If you need a refill on your cardiac medications before your next appointment, please call your pharmacy*  Lab Work: NONE ordered at this time of appointment   If you have labs (blood work) drawn today and your tests are completely normal, you will receive your results only by: MyChart Message (if you have MyChart) OR A paper copy in the mail If you have any lab test that is abnormal or we need to change your treatment, we will call you to review the results.  Testing/Procedures: NONE ordered at this time of appointment   Follow-Up: At [facility], you and your health needs are our priority.  As part of our continuing mission to provide you with exceptional heart care, we have created designated Provider Care Teams.  These Care Teams include your primary Cardiologist (physician) and Advanced Practice Providers (APPs -  Physician Assistants and Nurse Practitioners) who all work together to provide you with the care you need, when you need it.  Your next appointment:   3 month(s)  The format for your next appointment:   In Person  Provider:   APP  Other Instructions

## 2020-10-10 ENCOUNTER — Telehealth: Payer: Self-pay

## 2020-10-10 NOTE — Telephone Encounter (Signed)
Palliative care consult rescheduled for 10/24/20 @ 10AM with Dr. Bufford Spikes. Documentation will be noted in [facility] EMR [facility].

## 2020-10-11 NOTE — Addendum Note (Signed)
Addended by: Hedda Slade on: 10/11/2020 08:51 AM   Modules accepted: Orders

## 2020-10-12 DIAGNOSIS — J9601 Acute respiratory failure with hypoxia: Secondary | ICD-10-CM | POA: Diagnosis not present

## 2020-10-12 DIAGNOSIS — E43 Unspecified severe protein-calorie malnutrition: Secondary | ICD-10-CM | POA: Diagnosis not present

## 2020-10-12 DIAGNOSIS — Z7901 Long term (current) use of anticoagulants: Secondary | ICD-10-CM | POA: Diagnosis not present

## 2020-10-12 DIAGNOSIS — I480 Paroxysmal atrial fibrillation: Secondary | ICD-10-CM | POA: Diagnosis not present

## 2020-10-12 DIAGNOSIS — M810 Age-related osteoporosis without current pathological fracture: Secondary | ICD-10-CM | POA: Diagnosis not present

## 2020-10-12 DIAGNOSIS — I110 Hypertensive heart disease with heart failure: Secondary | ICD-10-CM | POA: Diagnosis not present

## 2020-10-12 DIAGNOSIS — Z9181 History of falling: Secondary | ICD-10-CM | POA: Diagnosis not present

## 2020-10-12 DIAGNOSIS — F0390 Unspecified dementia without behavioral disturbance: Secondary | ICD-10-CM | POA: Diagnosis not present

## 2020-10-12 DIAGNOSIS — H9193 Unspecified hearing loss, bilateral: Secondary | ICD-10-CM | POA: Diagnosis not present

## 2020-10-12 DIAGNOSIS — I509 Heart failure, unspecified: Secondary | ICD-10-CM | POA: Diagnosis not present

## 2020-10-17 DIAGNOSIS — I5021 Acute systolic (congestive) heart failure: Secondary | ICD-10-CM | POA: Diagnosis not present

## 2020-10-17 DIAGNOSIS — I4891 Unspecified atrial fibrillation: Secondary | ICD-10-CM | POA: Diagnosis not present

## 2020-10-18 LAB — BASIC METABOLIC PANEL
BUN/Creatinine Ratio: 14 (ref 12–28)
BUN: 16 mg/dL (ref 8–27)
CO2: 26 mmol/L (ref 20–29)
Calcium: 9.3 mg/dL (ref 8.7–10.3)
Chloride: 101 mmol/L (ref 96–106)
Creatinine, Ser: 1.13 mg/dL — ABNORMAL HIGH (ref 0.57–1.00)
Glucose: 108 mg/dL — ABNORMAL HIGH (ref 65–99)
Potassium: 4.3 mmol/L (ref 3.5–5.2)
Sodium: 144 mmol/L (ref 134–144)
eGFR: 47 mL/min/{1.73_m2} — ABNORMAL LOW (ref >59)

## 2020-10-18 NOTE — Addendum Note (Signed)
Addended by: Rollene Rotunda on: 10/18/2020 07:14 AM   Modules accepted: Orders

## 2020-10-24 ENCOUNTER — Other Ambulatory Visit: Payer: Medicare Other | Admitting: Nurse Practitioner

## 2020-10-24 ENCOUNTER — Other Ambulatory Visit: Payer: Medicare Other | Admitting: Internal Medicine

## 2020-11-13 DIAGNOSIS — R4189 Other symptoms and signs involving cognitive functions and awareness: Secondary | ICD-10-CM | POA: Diagnosis not present

## 2020-11-13 DIAGNOSIS — M8000XD Age-related osteoporosis with current pathological fracture, unspecified site, subsequent encounter for fracture with routine healing: Secondary | ICD-10-CM | POA: Diagnosis not present

## 2020-11-13 DIAGNOSIS — Z0000 Encounter for general adult medical examination without abnormal findings: Secondary | ICD-10-CM | POA: Diagnosis not present

## 2020-11-13 DIAGNOSIS — I509 Heart failure, unspecified: Secondary | ICD-10-CM | POA: Diagnosis not present

## 2020-11-21 DIAGNOSIS — Z515 Encounter for palliative care: Secondary | ICD-10-CM | POA: Diagnosis not present

## 2020-11-21 DIAGNOSIS — M810 Age-related osteoporosis without current pathological fracture: Secondary | ICD-10-CM | POA: Diagnosis not present

## 2020-11-21 DIAGNOSIS — I480 Paroxysmal atrial fibrillation: Secondary | ICD-10-CM | POA: Diagnosis not present

## 2020-11-21 DIAGNOSIS — R54 Age-related physical debility: Secondary | ICD-10-CM | POA: Diagnosis not present

## 2020-12-12 ENCOUNTER — Ambulatory Visit
Admission: RE | Admit: 2020-12-12 | Discharge: 2020-12-12 | Disposition: A | Discharge status: Home / Self Care | Payer: Medicare Other | Source: Ambulatory Visit | Attending: Neurology | Admitting: Neurology

## 2020-12-12 ENCOUNTER — Other Ambulatory Visit: Payer: Self-pay

## 2020-12-12 DIAGNOSIS — R413 Other amnesia: Secondary | ICD-10-CM

## 2020-12-14 ENCOUNTER — Telehealth: Payer: Self-pay | Admitting: Neurology

## 2020-12-14 NOTE — Telephone Encounter (Signed)
I called the patient.  I talk with the daughter.  CT shows minimal white matter changes, generalized and temporal atrophy that is likely consistent with Alzheimer's disease.  They do not wish to start medication for memory, we will follow the memory over time.   CT head 12/14/20:  IMPRESSION: This CT scan of the head without contrast shows the following: 1.  No acute findings. 2.  Generalized cortical atrophy most pronounced in the temporal lobes and left greater than right insular regions. 3.  Mild white matter hypodensities consistent with mild chronic microvascular ischemic change.

## 2020-12-31 ENCOUNTER — Other Ambulatory Visit: Payer: Self-pay | Admitting: Cardiology

## 2021-01-01 NOTE — Telephone Encounter (Signed)
Prescription refill request for Eliquis received. Indication:atrial fib Last office visit:7/22 Scr:1.1 Age: 87 Weight:46.1 kg  Prescription refilled

## 2021-01-09 NOTE — Progress Notes (Signed)
Cardiology Clinic Note   Patient Name: Kathleen [name] Date of Encounter: 01/12/2021  Primary Care Provider:  Jarrett Soho, PA-C Primary Cardiologist:  Rollene Rotunda, MD  Patient Profile    Kathleen [name] 87 year old female presents the clinic today for follow-up evaluation of her acute systolic CHF and atrial fibrillation.  Past Medical History    Past Medical History:  Diagnosis Date   Atrial fibrillation (HCC)    Cardiomyopathy (HCC)    Dementia (HCC)    History reviewed. No pertinent surgical history.  Allergies  No Known Allergies  History of Present Illness    Kathleen [name] has a PMH of dementia, acute systolic CHF, and atrial fibrillation.  She was admitted with acute systolic CHF 6/22.  She was noted to be fluid volume overloaded and was in atrial fibrillation.  She received diuresis and was started on Eliquis.  She did not tolerate Entresto and was treated with furosemide and metoprolol.  She followed up with Dr. Antoine Poche on 10/06/2020.  During that time she was doing well.  She had returned to sinus rhythm.  Her daughter contacted the office for the appointment due to the patient having shortness of breath in the mornings.  Her daughter was utilizing a pulse oximeter that showed her oxygen saturation was 90% during the episodes.  During the office visit her oxygen saturation was in the mid 90s.  Her heart rate was 70-80.  Due to patient's severe dementia as she did not recall symptoms.  It was not felt that she was having any orthopnea or PND.  She denied chest discomfort.  Her weight had increased by couple of pounds on the office scale but remained stable at home.  She did not have any new lower extremity swelling.  She denied feeling her heart race or skip.  With a walking challenge in the office her oxygen dipped only to 90% with ambulation.  She presents to the clinic today for follow-up evaluation states she feels well.  She presents the clinic with her daughter.   They have been monitoring her oxygen saturation and weights daily.  These have been very stable.  Her exercise has somewhat decreased.  We reviewed the importance of p.o. hydration and increased physical activity.  I recommended that she practice coughing and deep breathing daily.  We will continue her current medications, daily weights, increase physical activity as tolerated, and follow-up in 4-6 months.  Today she denies chest pain, shortness of breath, lower extremity edema, fatigue, palpitations, melena, hematuria, hemoptysis, diaphoresis, weakness, presyncope, syncope, orthopnea, and PND.   Home Medications    Prior to Admission medications   Medication Sig Start Date End Date Taking? Authorizing Provider  apixaban (ELIQUIS) 2.5 MG TABS tablet TAKE 1 TABLET BY MOUTH TWICE A DAY 01/01/21   Rollene Rotunda, MD  feeding supplement (ENSURE ENLIVE / ENSURE PLUS) LIQD Take 237 mLs by mouth daily. 09/11/20   Rolly Salter, MD  furosemide (LASIX) 20 MG tablet Take 1 tablet (20 mg total) by mouth daily. 10/06/20   Rollene Rotunda, MD  metoprolol succinate (TOPROL-XL) 50 MG 24 hr tablet Take 1 tablet (50 mg total) by mouth daily. Take with or immediately following a meal. 10/06/20   Rollene Rotunda, MD  Multiple Vitamin (MULTIVITAMIN WITH MINERALS) TABS tablet Take 1 tablet by mouth daily. 09/12/20   Rolly Salter, MD    Family History    Family History  Problem Relation Age of Onset   Stroke Sister    Leukemia  Brother    She indicated that her mother is deceased. She indicated that the status of her father is unknown and reported the following: Father unknown. She indicated that her sister is alive. She indicated that her brother is deceased.  Social History    Social History   Socioeconomic History   Marital status: Widowed    Spouse name: Not on file   Number of children: Not on file   Years of education: Not on file   Highest education level: Not on file  Occupational History   Not on  file  Tobacco Use   Smoking status: Never   Smokeless tobacco: Never  Substance and Sexual Activity   Alcohol use: Never   Drug use: Never   Sexual activity: Not on file  Other Topics Concern   Not on file  Social History Narrative   Lives with Daughter April   Right Handed   Drinks 1/2 - 1 liter of Dr. Reino Kent Caffeine   Social Determinants of Health   Financial Resource Strain: Not on file  Food Insecurity: Not on file  Transportation Needs: Not on file  Physical Activity: Not on file  Stress: Not on file  Social Connections: Not on file  Intimate Partner Violence: Not on file     Review of Systems    General:  No chills, fever, night sweats or weight changes.  Cardiovascular:  No chest pain, dyspnea on exertion, edema, orthopnea, palpitations, paroxysmal nocturnal dyspnea. Dermatological: No rash, lesions/masses Respiratory: No cough, dyspnea Urologic: No hematuria, dysuria Abdominal:   No nausea, vomiting, diarrhea, bright red blood per rectum, melena, or hematemesis Neurologic:  No visual changes, wkns, changes in mental status. All other systems reviewed and are otherwise negative except as noted above.  Physical Exam    VS:  BP (!) 118/58 (BP Location: Left Arm, Patient Position: Sitting, Cuff Size: Normal)   Pulse 78   Ht 5\' 5"  (1.651 m)   Wt 100 lb (45.4 kg)   BMI 16.64 kg/m  , BMI Body mass index is 16.64 kg/m. GEN: Well nourished, well developed, in no acute distress. HEENT: normal. Neck: Supple, no JVD, carotid bruits, or masses. Cardiac: RRR, no murmurs, rubs, or gallops. No clubbing, cyanosis, edema.  Radials/DP/PT 2+ and equal bilaterally.  Respiratory:  Respirations regular and unlabored, clear to auscultation bilaterally. GI: Soft, nontender, nondistended, BS + x 4. MS: no deformity or atrophy. Skin: warm and dry, no rash. Neuro:  Strength and sensation are intact. Psych: Normal affect.  Accessory Clinical Findings    Recent Labs: 09/06/2020:  ALT 30; B Natriuretic Peptide 1,461.6; Hemoglobin 12.3; Platelets 160 09/10/2020: Magnesium 2.3 10/17/2020: BUN 16; Creatinine, Ser 1.13; Potassium 4.3; Sodium 144   Recent Lipid Panel No results found for: CHOL, TRIG, HDL, CHOLHDL, VLDL, LDLCALC, LDLDIRECT  ECG personally reviewed by me today-none today.  Echocardiogram 09/07/2020 IMPRESSIONS     1. Left ventricular ejection fraction, by estimation, is 35 to 40%. The  left ventricle has moderately decreased function. The left ventricle  demonstrates global hypokinesis. There is mild concentric left ventricular  hypertrophy. Left ventricular  diastolic parameters are consistent with Grade I diastolic dysfunction  (impaired relaxation). Elevated left ventricular end-diastolic pressure.   2. Right ventricular systolic function is mildly reduced. The right  ventricular size is normal. There is normal pulmonary artery systolic  pressure. The estimated right ventricular systolic pressure is 32.4 mmHg.   3. The mitral valve is normal in structure. Mild mitral valve  regurgitation.  No evidence of mitral stenosis.   4. The aortic valve is normal in structure. Aortic valve regurgitation is  not visualized. No aortic stenosis is present.   5. The inferior vena cava is normal in size with greater than 50%  respiratory variability, suggesting right atrial pressure of 3 mmHg.  Assessment & Plan   1.  Acute systolic CHF-euvolemic today.  No increased DOE or activity intolerance.  Plan of care previously discussed and decision was made not to pursue ischemic evaluation secondary to her age and frailty.  She did not tolerate Entresto previously.  Daughter reports breathing has remained stable in the mornings.  No plans for ordering home O2 at this time.  Patient tolerated increased diuresis well. Continue furosemide, metoprolol Heart healthy low-sodium high-fiber diet Increase physical activity as tolerated  Atrial fibrillation with RVR-heart rate  today 78 bpm.  Patient and daughter deny episodes of increased or irregular heartbeat.  CHA2DS2-VASc score 4.  Report compliance with apixaban.  No recent falls or bleeding issues. Continue metoprolol, apixaban Heart healthy low-sodium diet Increase physical activity as tolerated Avoid triggers caffeine, chocolate, EtOH, dehydration etc.  Disposition: Follow-up with Dr. Antoine Poche or me in 4-6 months.   Thomasene Ripple. Sueellen Kayes NP-C    01/12/2021, 10:52 AM [facility] [address] Office [phone] Fax [phone]  Notice: This dictation was prepared with Dragon dictation along with smaller phrase technology. Any transcriptional errors that result from this process are unintentional and may not be corrected upon review.  I spent 13 minutes examining this patient, reviewing medications, and using patient centered shared decision making involving her cardiac care.  Prior to her visit I spent greater than 20 minutes reviewing her past medical history,  medications, and prior cardiac tests.

## 2021-01-12 ENCOUNTER — Encounter: Payer: Self-pay | Admitting: General Practice

## 2021-01-12 ENCOUNTER — Ambulatory Visit: Payer: Medicare Other | Admitting: General Practice

## 2021-01-12 ENCOUNTER — Other Ambulatory Visit: Payer: Self-pay

## 2021-01-12 VITALS — BP 118/58 | HR 78 | Ht 65.0 in | Wt 100.0 lb

## 2021-01-12 DIAGNOSIS — I4891 Unspecified atrial fibrillation: Secondary | ICD-10-CM | POA: Diagnosis not present

## 2021-01-12 DIAGNOSIS — I5021 Acute systolic (congestive) heart failure: Secondary | ICD-10-CM | POA: Diagnosis not present

## 2021-01-12 NOTE — Patient Instructions (Signed)
Medication Instructions:  The current medical regimen is effective;  continue present plan and medications as directed. Please refer to the Current Medication list given to you today.   *If you need a refill on your cardiac medications before your next appointment, please call your pharmacy*  Lab Work:   Testing/Procedures:  NONE    NONE  Special Instructions MAKE SURE YOU DO YOUR COUGHING AND DEEP BREATHING EXERCISES EVERY DAY  PLEASE INCREASE PHYSICAL ACTIVITY AS TOLERATED  SLIGHTLY INCREASE YOUR HYDRATION  Follow-Up: Your next appointment:  4-6 month(s) In Person with Rollene Rotunda, MD OR IF UNAVAILABLE JESSE CLEAVER, FNP-C   At [facility], you and your health needs are our priority.  As part of our continuing mission to provide you with exceptional heart care, we have created designated Provider Care Teams.  These Care Teams include your primary Cardiologist (physician) and Advanced Practice Providers (APPs -  Physician Assistants and Nurse Practitioners) who all work together to provide you with the care you need, when you need it.

## 2021-01-16 ENCOUNTER — Encounter: Payer: Self-pay | Admitting: Neurology

## 2021-01-16 ENCOUNTER — Ambulatory Visit: Payer: Medicare Other | Admitting: Neurology

## 2021-01-16 DIAGNOSIS — F0280 Dementia in other diseases classified elsewhere without behavioral disturbance: Secondary | ICD-10-CM | POA: Diagnosis not present

## 2021-01-16 DIAGNOSIS — G309 Alzheimer's disease, unspecified: Secondary | ICD-10-CM | POA: Diagnosis not present

## 2021-01-16 HISTORY — DX: Dementia in other diseases classified elsewhere, unspecified severity, without behavioral disturbance, psychotic disturbance, mood disturbance, and anxiety: F02.80

## 2021-01-16 NOTE — Progress Notes (Signed)
Reason for visit: Alzheimer's disease  Kathleen [name] is an 87 y.o. female  History of present illness:  Kathleen [name] is an 87 year old right-handed white female with a history of a progressive memory disturbance.  CT of the brain shows extensive atrophy, no significant level of small vessel disease.  The family does not wish to start her on any medications for memory.  She was in the hospital on 06 September 2020 with congestive heart failure and atrial fibrillation with rapid ventricular response.  She has done much better with her cardiac symptoms recently.  She is now on anticoagulant therapy.  She returns here for further evaluation.  Past Medical History:  Diagnosis Date   Atrial fibrillation (HCC)    Cardiomyopathy (HCC)    CHF (congestive heart failure) (HCC)    Dementia (HCC)     History reviewed. No pertinent surgical history.  Family History  Problem Relation Age of Onset   Stroke Sister    Leukemia Brother     Social history:  reports that she has never smoked. She has never used smokeless tobacco. She reports that she does not drink alcohol and does not use drugs.   No Known Allergies  Medications:  Prior to Admission medications   Medication Sig Start Date End Date Taking? Authorizing Provider  apixaban (ELIQUIS) 2.5 MG TABS tablet TAKE 1 TABLET BY MOUTH TWICE A DAY 01/01/21  Yes Hochrein, Fayrene Fearing, MD  feeding supplement (ENSURE ENLIVE / ENSURE PLUS) LIQD Take 237 mLs by mouth daily. 09/11/20  Yes Rolly Salter, MD  furosemide (LASIX) 20 MG tablet Take 1 tablet (20 mg total) by mouth daily. 10/06/20  Yes Rollene Rotunda, MD  metoprolol succinate (TOPROL-XL) 50 MG 24 hr tablet Take 1 tablet (50 mg total) by mouth daily. Take with or immediately following a meal. 10/06/20  Yes Hochrein, Fayrene Fearing, MD  Multiple Vitamin (MULTIVITAMIN WITH MINERALS) TABS tablet Take 1 tablet by mouth daily. 09/12/20  Yes Rolly Salter, MD    ROS:  Out of a complete 14 system review of symptoms, the  patient complains only of the following symptoms, and all other reviewed systems are negative.  Memory problems Shortness of breath  Blood pressure 120/66, pulse 76, height 5\' 5"  (1.651 m), weight 99 lb 9.6 oz (45.2 kg).  Physical Exam  General: The patient is alert and cooperative at the time of the examination.  Skin: No significant peripheral edema is noted.   Neurologic Exam  Mental status: The patient is alert and oriented x 3 at the time of the examination. The Mini-Mental status examination done today shows a total score of 17/30.  The patient is able to name 9 four-legged animals in 60 seconds.   Cranial nerves: Facial symmetry is present. Speech is normal, no aphasia or dysarthria is noted. Extraocular movements are full. Visual fields are full.  Motor: The patient has good strength in all 4 extremities.  Sensory examination: Soft touch sensation is symmetric on the face, arms, and legs.  Coordination: The patient has good finger-nose-finger and heel-to-shin bilaterally.  Mild apraxia is noted with the use of the extremities.  Gait and station: The patient has a normal gait. Romberg is negative. No drift is seen.  Reflexes: Deep tendon reflexes are symmetric.   CT head 12/14/20:   IMPRESSION: This CT scan of the head without contrast shows the following: 1.  No acute findings. 2.  Generalized cortical atrophy most pronounced in the temporal lobes and left greater than  right insular regions. 3.  Mild white matter hypodensities consistent with mild chronic microvascular ischemic change.  * CT scan images were reviewed online. I agree with the written report.    Assessment/Plan:  1.  Alzheimer's disease  The family does not wish to go on medications at this time.  We will continue to follow the patient on a conservative basis, she will follow-up in 8 months.  In the future, she can be followed through Dr. Marjory Lies.  Marlan Palau MD 01/16/2021 1:11  PM  [facility] [address] [place], [place] [address]  Phone [phone] Fax [phone]

## 2021-02-21 DIAGNOSIS — R54 Age-related physical debility: Secondary | ICD-10-CM | POA: Diagnosis not present

## 2021-02-21 DIAGNOSIS — G301 Alzheimer's disease with late onset: Secondary | ICD-10-CM | POA: Diagnosis not present

## 2021-02-21 DIAGNOSIS — Z515 Encounter for palliative care: Secondary | ICD-10-CM | POA: Diagnosis not present

## 2021-02-21 DIAGNOSIS — J9611 Chronic respiratory failure with hypoxia: Secondary | ICD-10-CM | POA: Diagnosis not present

## 2021-02-25 ENCOUNTER — Encounter: Payer: Self-pay | Admitting: Cardiology

## 2021-05-13 ENCOUNTER — Encounter: Payer: Self-pay | Admitting: Cardiology

## 2021-05-21 ENCOUNTER — Other Ambulatory Visit: Payer: Self-pay

## 2021-05-21 ENCOUNTER — Other Ambulatory Visit: Payer: Medicare Other | Admitting: *Deleted

## 2021-05-21 DIAGNOSIS — Z515 Encounter for palliative care: Secondary | ICD-10-CM

## 2021-05-21 NOTE — Progress Notes (Signed)
[facility] RN NOTE  PATIENT NAME: Kathleen [name] DOB: Mar 27, 1930 MRN: [id number]  PRIMARY CARE PROVIDER: Marda Stalker, PA-C  RESPONSIBLE PARTY: April Wegman (daughter) Acct ID - Guarantor Home Phone Work Phone Relationship Acct Type  [id number] LILIANI, WOJTAS [phone]  Self P/F     [address], [place], [place] [address]   Due to the COVID-19 crisis, this virtual check-in visit was done via telephone from my office and it was initiated and consent by this patient and or family.  RN palliative care telephonic encounter completed with patient's daughter-April. April was concerned that patient has become more lethargic and weak over the past week. She says this started around 05/10/21. Patient had nearly stopped eating, other than a few bites and sips before pushing her plate away. The only reason she even ate this much was due to daughter's encouragement. She has been monitoring her blood pressure and pulse. She has a very irregular heart rate so the rates have been "all over the place." She noticed around day 4 or 5 that patient was experiencing some facial swelling and her speech was "funny." She has been having ongoing issues with a bad tooth. Daughter says her tooth was never extracted because she stopped complaining about it. She has had 5 dental appointments, which each specialty having to refer her to someone else. She saw the 5th dentist last Thursday who states patient will more than likely need this tooth removed. They are currently waiting on a cardiac consult before moving forward with this. She states around Day 7-9 patient continued not to eat and started running a fever, highest reading being 100.4. On this day she only took about 2 bites of food and promptly vomited. The dentist had recommended patient start Doxycycline 100 mg tabs daily x 10 days that they prescribed. Today will be day 4 of this antibiotic. She also had been giving patient Tylenol to  help with her fever. No fevers over the past 2 days. She feels that yesterday patient started to turn a corner and improve. She would still like an in-person visit this week to assess patient's condition. RN visit scheduled for tomorrow 05/22/21@1p .  (Duration of visit and documentation 30 minutes)    Daryl Eastern, RN BSN

## 2021-05-22 ENCOUNTER — Other Ambulatory Visit: Payer: Medicare Other | Admitting: *Deleted

## 2021-05-22 ENCOUNTER — Other Ambulatory Visit: Payer: Self-pay

## 2021-05-22 DIAGNOSIS — Z515 Encounter for palliative care: Secondary | ICD-10-CM

## 2021-05-24 NOTE — Progress Notes (Signed)
[facility] RN NOTE  PATIENT NAME: Kathleen [name] DOB: 1929-09-26 MRN: [id number]  PRIMARY CARE PROVIDER: Marda Stalker, PA-C  RESPONSIBLE PARTY: Deniece Ree (daughter) Acct ID - Guarantor Home Phone Work Phone Relationship Acct Type  [id number] AFIFA, TRU[employer] [phone]  Self P/F     [address], [place], [place] [address]   Covid-19 Pre-screening Negative  RN follow up palliative care visit completed with patient and her two daughters. Daughters provided update first before I met with patient. They report that patient continues with increased lethargy, fatigue, facial swelling and poor appetite. She has been having issues with an infected tooth and is currently taking Doxycycline 100 mg daily x 10 days. Today is Day 5. Yesterday afternoon they report that her face seems less swollen but she was very lethargic again. She slept most of the day in her recliner and went back to bed twice. She only ate 1-2 crackers and about 4 spoonfuls of brown rice. Patient did however get dressed for the first time today in 2 days. She slept in late this morning until after 10a. Daughter made her eat a cracker in order to give the medications and she then went back to sleep. Daughter is concerned about current symptoms, especially her poor appetite and weakness. She feels she is allowing patient to starve herself and at one point was going to call EMS to take her to the hospital. Patient told her she did not want to go to the hospital. Education provided. Daughter states that patient is still able to perform ADLs independently. She is still ambulatory. During my assessment, patient had walked into the kitchen and started making herself a banana sandwich. She denied pain or discomfort. She does have dementia and poor memory but did not appear to be in any distress and states she "feels fine." Advised that infection and being on an antibiotic can cause some stomach upset, decrease in  appetite and some fatigue. I suggested that she continue to encourage fluids and eating and allow patient to get her rest. Will follow up with patient after course of antibiotics are complete. Daughter knows to call if condition worsens.    (Duration of visit and documentation 75 minutes)    Daryl Eastern, RN BSN

## 2021-05-31 ENCOUNTER — Other Ambulatory Visit: Payer: Self-pay

## 2021-05-31 ENCOUNTER — Encounter: Payer: Self-pay | Admitting: Cardiology

## 2021-05-31 ENCOUNTER — Other Ambulatory Visit: Payer: Medicare Other | Admitting: *Deleted

## 2021-06-01 MED ORDER — FUROSEMIDE 20 MG PO TABS: ORAL_TABLET | ORAL | 3 refills | Status: AC

## 2021-06-01 NOTE — Telephone Encounter (Signed)
Spoke with pt daughter, aware of dr hochrein's recommendations. 

## 2021-07-04 ENCOUNTER — Other Ambulatory Visit: Payer: Self-pay | Admitting: Cardiology

## 2021-07-05 NOTE — Progress Notes (Signed)
?  ?Cardiology Office Note ? ? ?Date:  07/06/2021  ? ?ID:  Kathleen [name], DOB [birth year], MRN [id number] ? ?PCP:  Marda Stalker, PA-C  ?Cardiologist:   Minus Breeding, MD ? ? ?Chief Complaint  ?Patient presents with  ? Atrial Fibrillation  ? ? ? ? ?  ?History of Present Illness: ?Kathleen [name] is a 88 y.o. female who presents for follow up of a recent admission for CHF earlier this month.   She has dementia.  She was admitted with volume overload.  She had an EF of 35%.  She did not tolerate Entresto and was treated with Lasix and Toprol.  She was in atrial fib and started on Eliquis.     ? ?Since I last saw her she had some problems with what ended up being a tooth that needed to be pulled.  She had lethargy and fevers and facial swelling.  When she took an antibiotic she got diarrhea and lost weight.  She subsequently probably got dehydrated and was still taking Lasix.  She was weak and lightheaded and the Lasix was eventually discontinued and she is slowly recovering.  The tooth was pulled.  She did lose weight.  She is not having any acute shortness of breath, PND or orthopnea.  Has had no new palpitations, presyncope or syncope.  Her daughter takes excellent care of her.  The patient has dementia.  I did review some blood pressure diary using some oxygen saturations and they are normal. ? ? ?Past Medical History:  ?Diagnosis Date  ? Alzheimer disease ([facility]) 01/16/2021  ? Atrial fibrillation ([place])   ? Cardiomyopathy ([place])   ? CHF (congestive heart failure) ([place])   ? Dementia ([place])   ? ? ?History reviewed. No pertinent surgical history. ? ? ?Current Outpatient Medications  ?Medication Sig Dispense Refill  ? apixaban (ELIQUIS) 2.5 MG TABS tablet TAKE ONE TABLET BY MOUTH TWICE A DAY 180 tablet 1  ? feeding supplement (ENSURE ENLIVE / ENSURE PLUS) LIQD Take 237 mLs by mouth daily. 3000 mL 0  ? furosemide (LASIX) 20 MG tablet Take 1 tablet as needed for shortness of breath, swelling or weight gain of 2 lbs overnight.  90 tablet 3  ? metoprolol succinate (TOPROL-XL) 50 MG 24 hr tablet Take 1 tablet (50 mg total) by mouth daily. Take with or immediately following a meal. 90 tablet 3  ? Multiple Vitamin (MULTIVITAMIN WITH MINERALS) TABS tablet Take 1 tablet by mouth daily. 30 tablet 0  ? ?No current facility-administered medications for this visit.  ? ? ?Allergies:   Patient has no known allergies.  ? ? ?ROS:  Please see the history of present illness.   Otherwise, review of systems are positive for none.   All other systems are reviewed and negative.  ? ? ?PHYSICAL EXAM: ?VS:  BP 110/60 (BP Location: Left Arm)   Pulse 87   Ht 5\' 5"  (1.651 m)   Wt 94 lb 3.2 oz (42.7 kg)   SpO2 97%   BMI 15.68 kg/m?  , BMI Body mass index is 15.68 kg/m?. ?GENERAL: Frail appearing ?NECK:  No jugular venous distention, waveform within normal limits, carotid upstroke brisk and symmetric, no bruits, no thyromegaly ?LUNGS:  Clear to auscultation bilaterally ?CHEST:  Unremarkable ?HEART:  PMI not displaced or sustained,S1 and S2 within normal limits, no S3, no S4, no clicks, no rubs, no murmurs ?ABD:  Flat, positive bowel sounds normal in frequency in pitch, no bruits, no rebound, no guarding, no midline pulsatile  mass, no hepatomegaly, no splenomegaly ?EXT:  2 plus pulses throughout, no edema, no cyanosis no clubbing ? ? ?EKG:  EKG is  ordered today. ?The ekg ordered today demonstrates sinus rhythm, rate 87, axis within normal limits, intervals within normal limits, premature ventricular contractions, no acute ST-T wave changes.   ? ? ?Recent Labs: ?09/06/2020: ALT 30; B Natriuretic Peptide 1,461.6; Hemoglobin 12.3; Platelets 160 ?09/10/2020: Magnesium 2.3 ?10/17/2020: BUN 16; Creatinine, Ser 1.13; Potassium 4.3; Sodium 144  ? ? ?Lipid Panel ?No results found for: CHOL, TRIG, HDL, CHOLHDL, VLDL, LDLCALC, LDLDIRECT ?  ? ?Wt Readings from Last 3 Encounters:  ?07/06/21 94 lb 3.2 oz (42.7 kg)  ?01/16/21 99 lb 9.6 oz (45.2 kg)  ?01/12/21 100 lb (45.4 kg)  ?   ? ? ?Other studies Reviewed: ?Additional studies/ records that were reviewed today include: None ?Review of the above records demonstrates:  NA ? ? ?ASSESSMENT AND PLAN: ? ?Acute systolic HF:   She is euvolemic and doing well without Lasix.  She still has this as needed.  She does not tolerate med titration.  No change in therapy.  ? ?Atrial fib with RVR:   She has had no symptomatic paroxysms.Ms. Ariele Tomita has a CHA2DS2 - VASc score of 4.  She tolerates anticoagulation.  No change in therapy.  I will check a CBC and basic metabolic profile today. ? ? ?Current medicines are reviewed at length with the patient today.  The patient does not have concerns regarding medicines. ? ?The following changes have been made: None ? ?Labs/ tests ordered today include:  ? ?Orders Placed This Encounter  ?Procedures  ? Basic metabolic panel  ? CBC  ? EKG 12-Lead  ? ? ? ? ?Disposition:   FU with 86 months. ? ? ?Signed, ?Minus Breeding, MD  ?07/06/2021 11:43 AM    ?[facility] ? ? ? ?

## 2021-07-05 NOTE — Telephone Encounter (Signed)
Prescription refill request for Eliquis received. ?Indication:Afib ?Last office visit:10/22 ?Scr:1.1 ?Age: 88 ?Weight:45.2 kg ? ?Prescription refilled ? ?

## 2021-07-06 ENCOUNTER — Encounter: Payer: Self-pay | Admitting: Cardiology

## 2021-07-06 ENCOUNTER — Ambulatory Visit: Payer: Medicare Other | Admitting: Cardiology

## 2021-07-06 VITALS — BP 110/60 | HR 87 | Ht 65.0 in | Wt 94.2 lb

## 2021-07-06 DIAGNOSIS — I4891 Unspecified atrial fibrillation: Secondary | ICD-10-CM | POA: Diagnosis not present

## 2021-07-06 DIAGNOSIS — I5021 Acute systolic (congestive) heart failure: Secondary | ICD-10-CM | POA: Diagnosis not present

## 2021-07-06 NOTE — Patient Instructions (Signed)
Medication Instructions:  ?No changes ?*If you need a refill on your cardiac medications before your next appointment, please call your pharmacy* ? ? ?Lab Work: ?Your provider would like for you to have the following labs today: BMET and CBC ? ?If you have labs (blood work) drawn today and your tests are completely normal, you will receive your results only by: ?MyChart Message (if you have MyChart) OR ?A paper copy in the mail ?If you have any lab test that is abnormal or we need to change your treatment, we will call you to review the results. ? ? ?Testing/Procedures: ?None ordered ? ? ?Follow-Up: ?At [facility], you and your health needs are our priority.  As part of our continuing mission to provide you with exceptional heart care, we have created designated Provider Care Teams.  These Care Teams include your primary Cardiologist (physician) and Advanced Practice Providers (APPs -  Physician Assistants and Nurse Practitioners) who all work together to provide you with the care you need, when you need it. ? ?We recommend signing up for the patient portal called "MyChart".  Sign up information is provided on this After Visit Summary.  MyChart is used to connect with patients for Virtual Visits (Telemedicine).  Patients are able to view lab/test results, encounter notes, upcoming appointments, etc.  Non-urgent messages can be sent to your provider as well.   ?To learn more about what you can do with MyChart, go to [email or web address].   ? ?Your next appointment:   ?12 month(s) ? ?The format for your next appointment:   ?In Person ? ?Provider:   ?Rollene Rotunda, MD { ? ? ?

## 2021-07-07 LAB — BASIC METABOLIC PANEL
BUN/Creatinine Ratio: 8 — ABNORMAL LOW (ref 12–28)
BUN: 8 mg/dL (ref 8–27)
CO2: 24 mmol/L (ref 20–29)
Calcium: 9.2 mg/dL (ref 8.7–10.3)
Chloride: 105 mmol/L (ref 96–106)
Creatinine, Ser: 0.96 mg/dL (ref 0.57–1.00)
Glucose: 98 mg/dL (ref 70–99)
Potassium: 4.7 mmol/L (ref 3.5–5.2)
Sodium: 145 mmol/L — ABNORMAL HIGH (ref 134–144)
eGFR: 57 mL/min/{1.73_m2} — ABNORMAL LOW (ref >59)

## 2021-07-07 LAB — CBC
Hematocrit: 29.5 % — ABNORMAL LOW (ref 34.0–46.6)
Hemoglobin: 9.6 g/dL — ABNORMAL LOW (ref 11.1–15.9)
MCH: 32 pg (ref 26.6–33.0)
MCHC: 32.5 g/dL (ref 31.5–35.7)
MCV: 98 fL — ABNORMAL HIGH (ref 79–97)
Platelets: 189 10*3/uL (ref 150–450)
RBC: 3 x10E6/uL — ABNORMAL LOW (ref 3.77–5.28)
RDW: 14.5 % (ref 11.7–15.4)
WBC: 6 10*3/uL (ref 3.4–10.8)

## 2021-07-09 ENCOUNTER — Other Ambulatory Visit: Payer: Self-pay | Admitting: *Deleted

## 2021-07-09 DIAGNOSIS — D649 Anemia, unspecified: Secondary | ICD-10-CM

## 2021-09-04 ENCOUNTER — Encounter: Payer: Self-pay | Admitting: *Deleted

## 2021-09-12 ENCOUNTER — Other Ambulatory Visit: Payer: Medicare Other | Admitting: Family Medicine

## 2021-09-12 ENCOUNTER — Encounter: Payer: Self-pay | Admitting: Family Medicine

## 2021-09-12 VITALS — BP 102/60 | HR 72 | Resp 22 | Wt 96.2 lb

## 2021-09-12 DIAGNOSIS — I4821 Permanent atrial fibrillation: Secondary | ICD-10-CM

## 2021-09-12 DIAGNOSIS — I5021 Acute systolic (congestive) heart failure: Secondary | ICD-10-CM | POA: Diagnosis not present

## 2021-09-12 NOTE — Progress Notes (Signed)
[facility] Consult Note Telephone: [phone]  Fax: [phone]    Date of encounter: 09/12/21 5:53 PM PATIENT NAME: Kathleen [name] [address] [place] [address]   [phone] (home)  DOB: 19-Oct-1929 MRN: [id number] PRIMARY CARE PROVIDER:    Lenetta [name]  [place] [address] [phone]  REFERRING PROVIDER:   Marda Stalker, PA-C [place],  [place] [address] [phone]  RESPONSIBLE PARTY:    Contact Information     Name Relation Home Work Kathleen [name] Daughter   [phone]   Kathleen [name] Daughter   [phone]        I met face to face with patient and daughter in their home. Palliative Care was asked to follow this patient by consultation request of  Kathleen Stalker, PA-C to address advance care planning and complex medical decision making. This is a follow up visit  ASSESSMENT , SYMPTOM MANAGEMENT AND PLAN / RECOMMENDATIONS:   Chronic systolic heart failure  Last EF 35-40% on 09/07/20 with no significant valvular disease Question dyspnea with early HF exacerbation vs acute respiratory failure Continue Toprol XL 50 mg daily with Lasix 20 mg daily. Daily weight and notify if > 2lb weight gain in 24 hours, 5lbs in 5 days. Follow up Monday  2.  Persistent atrial fibrillation Currently rate controlled on Toprol XL Continue chronic anticoagulation on Eliquis 2.5 mg BID, no evidence of bleeding    Advance Care Planning/Goals of Care: Goals include to maximize quality of life and symptom management.  CODE STATUS: MOST AS OF  11/21/20: DNR/DNI with comfort measures Use of antibiotics and IV fluids on a case by case, time limited basis No feeding tube.    Follow up Palliative Care Visit: Palliative care will continue to follow for complex medical decision making, advance care planning, and clarification of goals. Return 1 weeks or  prn.    This visit was coded based on medical decision making (MDM).  PPS: 60%  HOSPICE ELIGIBILITY/DIAGNOSIS: TBD  Chief Complaint:  Palliative Care is performing an acute visit to follow up dyspnea in setting of dementia.  HISTORY OF PRESENT ILLNESS:  Kathleen [name] is a 88 y.o. year old female with dementia, HTN, systolic CHF, atrial fibrillation on anticoagulation and severe protein calorie malnutrition.  Received TCF daughter indicating that pt is always baseline confused and cannot give much history but seems to be having short, shallow breaths around 40 per minute and mildly more lethargic/resting more today.  Pt denies CP, SOB, orthopnea, PND.  She endorses some DOE.  Denies falls, nausea, vomiting, bright red or dark tarry stools.  Daughter states that pt's appetite had really recently improved as a friend had brought in homemade candies and made dinners for them that the patient ate well.  She states the patient had slept longer today and had returned to bed to take a nap which is very unusual for her.     History obtained from review of EMR, discussion with daughter and/or Kathleen [name].  I reviewed available labs, medications, imaging, studies and related documents from the EMR.  Records reviewed and summarized above.   ROS General: NAD EYES: denies vision changes ENMT: denies dysphagia Cardiovascular: denies chest pain, endorses DOE Pulmonary: denies cough, endorses slight increased SOB Abdomen: endorses good appetite, denies constipation, endorses continence of bowel GU: denies dysuria, endorses continence of urine MSK:  denies increased weakness, no falls reported Skin: denies rashes or wounds  Neurological: denies pain, denies insomnia Psych: Endorses positive mood Heme/lymph/immuno: denies bruises, abnormal bleeding  Physical Exam: Current and past weights: 96.2 lbs on home scale Constitutional: NAD General: frail appearing, thin EYES: anicteric sclera, lids intact, no  discharge  ENMT: intact hearing, oral mucous membranes moist, dentition intact CV: S1S2, IRIR, no LE edema Pulmonary: Fine crackles bilateral bases, LUL exp wheeze, no increased work of breathing, no cough, room air Abdomen: normo-active BS + 4 quadrants, soft and non tender, no ascites GU: deferred MSK: no sarcopenia, moves all extremities, ambulatory Skin: warm and dry, no rashes or wounds on visible skin Neuro:  no generalized weakness,  noted short term memory deficits Psych: non-anxious affect, Pleasantly confused A & O x2 Hem/lymph/immuno: no widespread bruising   Thank you for the opportunity to participate in the care of Kathleen [name].  The palliative care team will continue to follow. Please call our office at [phone] if we can be of additional assistance.   Kathleen Conception, FNP -C  COVID-19 PATIENT SCREENING TOOL Asked and negative response unless otherwise noted:   Have you had symptoms of covid, tested positive or been in contact with someone with symptoms/positive test in the past 5-10 days?  No

## 2021-09-15 ENCOUNTER — Encounter: Payer: Self-pay | Admitting: Family Medicine

## 2021-09-15 DIAGNOSIS — I4821 Permanent atrial fibrillation: Secondary | ICD-10-CM | POA: Insufficient documentation

## 2021-09-17 ENCOUNTER — Other Ambulatory Visit: Payer: Medicare Other | Admitting: Family Medicine

## 2021-09-17 ENCOUNTER — Encounter: Payer: Self-pay | Admitting: Family Medicine

## 2021-09-17 VITALS — BP 128/68 | HR 62 | Resp 18

## 2021-09-17 DIAGNOSIS — I5021 Acute systolic (congestive) heart failure: Secondary | ICD-10-CM

## 2021-09-17 NOTE — Progress Notes (Signed)
Patient: Kathleen [name] Date of Birth: Mar 01, 1930  Reason for Visit: Follow up for memory History from: Patient, daughter April  Primary Neurologist: Dr. Dalia Heading. [name]  ASSESSMENT AND PLAN 88 y.o. year old female   1.  Alzheimer's disease -decline overtime, MMSE 16/30 -discussed memory medications, Aricept and Namenda, decided to hold off for now, revisit at next visit, would consider Namenda, worry about weight loss/decrease appetite with Aricept, she is 96 lbs -Discussed palliative care could follow going forward, for now, we will schedule 60-month follow-up at our office  HISTORY OF PRESENT ILLNESS: Today 09/18/21 Kathleen [name] is here today for follow-up. MMSE 16/30. Lives with daughter, April. Needs assistance/supervision for ADLs. Appetite is fair, sleeps well. Has palliative care come out. Has CHF, had to come out recently twice for SOB. Requiring more assistance with daily activity. Needs help with cooking, showering, dressing. Is an avid reader, the newspaper, reads it over and over. Naps more often. No falls. Not interested in memory medications.   HISTORY  01/16/2021 Dr. Anne Hahn: Kathleen [name] is an 88 year old right-handed white female with a history of a progressive memory disturbance.  CT of the brain shows extensive atrophy, no significant level of small vessel disease.  The family does not wish to start her on any medications for memory.  She was in the hospital on 06 September 2020 with congestive heart failure and atrial fibrillation with rapid ventricular response.  She has done much better with her cardiac symptoms recently.  She is now on anticoagulant therapy.  She returns here for further evaluation.  REVIEW OF SYSTEMS: Out of a complete 14 system review of symptoms, the patient complains only of the following symptoms, and all other reviewed systems are negative.  See HPI  ALLERGIES: No Known Allergies  HOME MEDICATIONS: Outpatient Medications Prior to Visit   Medication Sig Dispense Refill   apixaban (ELIQUIS) 2.5 MG TABS tablet TAKE ONE TABLET BY MOUTH TWICE A DAY 180 tablet 1   feeding supplement (ENSURE ENLIVE / ENSURE PLUS) LIQD Take 237 mLs by mouth daily. 3000 mL 0   furosemide (LASIX) 20 MG tablet Take 1 tablet as needed for shortness of breath, swelling or weight gain of 2 lbs overnight. 90 tablet 3   metoprolol succinate (TOPROL-XL) 50 MG 24 hr tablet Take 1 tablet (50 mg total) by mouth daily. Take with or immediately following a meal. 90 tablet 3   Multiple Vitamin (MULTIVITAMIN WITH MINERALS) TABS tablet Take 1 tablet by mouth daily. 30 tablet 0   No facility-administered medications prior to visit.    PAST MEDICAL HISTORY: Past Medical History:  Diagnosis Date   Alzheimer disease ([facility]) 01/16/2021   Atrial fibrillation ([facility])    Cardiomyopathy ([facility])    CHF (congestive heart failure) ([facility])    Dementia ([facility])     PAST SURGICAL HISTORY: History reviewed. No pertinent surgical history.  FAMILY HISTORY: Family History  Problem Relation Age of Onset   Stroke Sister    Leukemia Brother     SOCIAL HISTORY: Social History   Socioeconomic History   Marital status: Widowed    Spouse name: Not on file   Number of children: 2   Years of education: Not on file   Highest education level: 12th grade  Occupational History   Not on file  Tobacco Use   Smoking status: Never   Smokeless tobacco: Never  Substance and Sexual Activity   Alcohol use: Never   Drug use: Never   Sexual activity: Not  on file  Other Topics Concern   Not on file  Social History Narrative   Lives with Daughter April 01/16/21   Right Handed   Drinks 1/2 - 1 liter of Dr Reino Kent Caffeine daily    Social Determinants of Health   Financial Resource Strain: Not on file  Food Insecurity: Not on file  Transportation Needs: Not on file  Physical Activity: Not on file  Stress: Not on file  Social Connections: Not on file  Intimate Partner Violence: Not on  file    PHYSICAL EXAM  Vitals:   09/18/21 1227  BP: 124/69  Pulse: 77  Weight: 96 lb (43.5 kg)  Height: 5\' 2"  (1.575 m)   Body mass index is 17.56 kg/m.    09/18/2021   12:31 PM 01/16/2021    1:08 PM 07/20/2020    3:16 PM  MMSE - Mini Mental State Exam  Orientation to time 1 2 2   Orientation to Place 3 2 4   Registration 3 2 3   Attention/ Calculation 0 1 5  Recall 0 2 0  Language- name 2 objects 2 2 2   Language- repeat 1 0 0  Language- follow 3 step command 3 3 3   Language- read & follow direction 1 1 1   Write a sentence 1 1 1   Copy design 1 1 1   Total score 16 17 22    Generalized: Well developed, in no acute distress, frail elderly female Neurological examination  Mentation: Alert, history mostly provided by her daughter. Follows all commands speech and language fluent. Pleasant, smiling, engaged Cranial nerve II-XII: Pupils were equal round reactive to light. Extraocular movements were full, visual field were full on confrontational test. Facial sensation and strength were normal.  Head turning and shoulder shrug were normal and symmetric. Motor: Good strength overall, no muscle weakness noted Sensory: Sensory testing is intact to soft touch on all 4 extremities. No evidence of extinction is noted.  Coordination: Cerebellar testing reveals good finger-nose-finger and heel-to-shin bilaterally.  Gait and station: Gait is slightly wide-based, but steady and independent Reflexes: Deep tendon reflexes are symmetric and normal bilaterally.   DIAGNOSTIC DATA (LABS, IMAGING, TESTING) - I reviewed patient records, labs, notes, testing and imaging myself where available.  Lab Results  Component Value Date   WBC 6.0 07/06/2021   HGB 9.6 (L) 07/06/2021   HCT 29.5 (L) 07/06/2021   MCV 98 (H) 07/06/2021   PLT 189 07/06/2021      Component Value Date/Time   NA 145 (H) 07/06/2021 1153   K 4.7 07/06/2021 1153   CL 105 07/06/2021 1153   CO2 24 07/06/2021 1153   GLUCOSE 98  07/06/2021 1153   GLUCOSE 96 09/10/2020 0743   BUN 8 07/06/2021 1153   CREATININE 0.96 07/06/2021 1153   CALCIUM 9.2 07/06/2021 1153   PROT 6.0 (L) 09/06/2020 1326   ALBUMIN 3.8 09/06/2020 1326   AST 32 09/06/2020 1326   ALT 30 09/06/2020 1326   ALKPHOS 70 09/06/2020 1326   BILITOT 0.5 09/06/2020 1326   GFRNONAA 59 (L) 09/10/2020 0743   No results found for: "CHOL", "HDL", "LDLCALC", "LDLDIRECT", "TRIG", "CHOLHDL" No results found for: "HGBA1C" No results found for: "VITAMINB12" No results found for: "TSH"  07/08/2021, AGNP-C, DNP 09/18/2021, 12:49 PM [facility] [address] [place], 11/06/2020 11/06/2020 [phone]

## 2021-09-17 NOTE — Progress Notes (Signed)
[facility] Consult Note Telephone: [phone]  Fax: [phone]    Date of encounter: 09/17/21 11:08 AM PATIENT NAME: Kathleen [name] [address] [place] [address]   [phone] (home)  DOB: 1929-10-21 MRN: [id number] PRIMARY CARE PROVIDER:    Lenetta Quaker  [place] [address] [phone]  REFERRING PROVIDER:   Marda Stalker, PA-C [place],  [place] [address] [phone]  RESPONSIBLE PARTY:    Contact Information     Name Relation Home Work [place], april Daughter   [phone]   Kela Millin Daughter   [phone]        I met face to face with patient and daughter in their home. Palliative Care was asked to follow this patient by consultation request of  Marda Stalker, PA-C to address advance care planning and complex medical decision making. This is a follow up visit  ASSESSMENT , SYMPTOM MANAGEMENT AND PLAN / RECOMMENDATIONS:   Chronic systolic heart failure  Acute exacerbation resolved with 3 doses Lasix  Improved sleep, decrease SOB and improved activity tolerance Continue Toprol XL 50 mg daily Daily weight and if > 2lb weight gain in 24 hours, 5 lbs/5 days, increase DOE/SOB  give Lasix every other day for 3 doses      Advance Care Planning/Goals of Care: Goals include to maximize quality of life and symptom management.  CODE STATUS: MOST AS OF  11/21/20: DNR/DNI with comfort measures Use of antibiotics and IV fluids on a case by case, time limited basis No feeding tube.    Follow up Palliative Care Visit: Palliative care will continue to follow for complex medical decision making, advance care planning, and clarification of goals. Return 1 weeks or prn.    This visit was coded based on medical decision making (MDM).  PPS: 60%  HOSPICE ELIGIBILITY/DIAGNOSIS: TBD  Chief Complaint:  Acute visit to follow up chronic heart  failure.Palliative Care is performing an acute visit to follow up dyspnea in setting of dementia.  HISTORY OF PRESENT ILLNESS:  Kathleen [name] is a 88 y.o. year old female with dementia, HTN, systolic CHF, atrial fibrillation on anticoagulation and severe protein calorie malnutrition.  Pt denies CP, SOB, orthopnea, PND.  Daughter corroborates or corrects history as needed.  She states that pt has been sleeping longer in bed, had some DOE making her breakfast and continues to have a lower level of tolerance to activity before becoming fatigued.   History obtained from discussion with daughter and/or Kathleen [name].  No new available labs, medications, imaging, studies or related documents in the EMR.  Records reviewed and summarized above.   ROS General: NAD Cardiovascular: denies chest pain, endorses DOE Pulmonary: denies cough, endorses slight increased SOB Neurological: sleep improved with no early arising Psych: Endorses positive mood Heme/lymph/immuno: denies bruises, abnormal bleeding  Physical Exam: Current and past weights: 94.4 lbs on home scale today, down from 96.2 lbs last week Constitutional: NAD General: frail appearing, thin CV: S1S2, IRR with premature beat, no LE edema Pulmonary: Fine crackles right base, otherwise clear, no increased work of breathing, no cough, room air    Thank you for the opportunity to participate in the care of Kathleen [name].  The palliative care team will continue to follow. Please call our office at [phone] if we can be of additional assistance.   Marijo Conception, FNP -C  COVID-19 PATIENT SCREENING TOOL Asked and negative response unless otherwise noted:  Have you had symptoms of covid, tested positive or been in contact with someone with symptoms/positive test in the past 5-10 days?  No

## 2021-09-18 ENCOUNTER — Encounter: Payer: Self-pay | Admitting: Neurology

## 2021-09-18 ENCOUNTER — Ambulatory Visit: Payer: Medicare Other | Admitting: Neurology

## 2021-09-18 VITALS — BP 124/69 | HR 77 | Ht 62.0 in | Wt 96.0 lb

## 2021-09-18 DIAGNOSIS — G309 Alzheimer's disease, unspecified: Secondary | ICD-10-CM

## 2021-09-18 DIAGNOSIS — F0280 Dementia in other diseases classified elsewhere without behavioral disturbance: Secondary | ICD-10-CM | POA: Diagnosis not present

## 2021-09-18 NOTE — Patient Instructions (Signed)
Great to see you today  Memory testing was 16/30 Hold off on memory medication Discuss with palliative care, ongoing memory management going forward Return back in 6 months Consider Aricept or Namenda

## 2021-09-24 DIAGNOSIS — D649 Anemia, unspecified: Secondary | ICD-10-CM | POA: Diagnosis not present

## 2021-09-24 LAB — CBC
Hematocrit: 38.1 % (ref 34.0–46.6)
Hemoglobin: 12.7 g/dL (ref 11.1–15.9)
MCH: 32.6 pg (ref 26.6–33.0)
MCHC: 33.3 g/dL (ref 31.5–35.7)
MCV: 98 fL — ABNORMAL HIGH (ref 79–97)
Platelets: 181 10*3/uL (ref 150–450)
RBC: 3.89 x10E6/uL (ref 3.77–5.28)
RDW: 11.8 % (ref 11.7–15.4)
WBC: 7 10*3/uL (ref 3.4–10.8)

## 2021-10-02 ENCOUNTER — Other Ambulatory Visit: Payer: Self-pay | Admitting: Cardiology

## 2021-10-18 ENCOUNTER — Encounter: Payer: Self-pay | Admitting: Family Medicine

## 2021-10-18 ENCOUNTER — Other Ambulatory Visit: Payer: Medicare Other | Admitting: Family Medicine

## 2021-10-18 VITALS — BP 120/58 | HR 77 | Resp 14 | Wt 93.8 lb

## 2021-10-18 DIAGNOSIS — I509 Heart failure, unspecified: Secondary | ICD-10-CM | POA: Diagnosis not present

## 2021-10-18 NOTE — Progress Notes (Signed)
[facility] Consult Note Telephone: [phone]  Fax: [phone]    Date of encounter: 10/18/21 11:34 AM PATIENT NAME: Kathleen [name] [address] [place] [address]   [phone] (home)  DOB: Sep 21, 1929 MRN: [id number] PRIMARY CARE PROVIDER:    Lenetta [name]  [place] [address] [phone]  REFERRING PROVIDER:   Marda Stalker, [name],  [place] [address] [phone]  RESPONSIBLE PARTY:    Contact Information     Name Relation Home Work [place], Kathleen [name] Daughter   [phone]   Kathleen [name] Daughter   [phone]        I met face to face with patient and daughter in their home. Palliative Care was asked to follow this patient by consultation request of  Kathleen [name] to address advance care planning and complex medical decision making. This is a follow up visit  ASSESSMENT , SYMPTOM MANAGEMENT AND PLAN / RECOMMENDATIONS:   Chronic systolic heart failure  Stable, appears euvolemic. EF 35-40% on 09/07/20 with no significant valvular disease Continue Toprol XL 50 mg daily with Lasix 20 mg daily prn if weight gain of 2-3 lbs/24 hrs or increased dyspnea. Daily weight and notify if > 2lb weight gain in 24 hours, 5lbs in 5 days.    Advance Care Planning/Goals of Care: Goals include to maximize quality of life and symptom management.  CODE STATUS: MOST AS OF  11/21/20: DNR/DNI with comfort measures Use of antibiotics and IV fluids on a case by case, time limited basis No feeding tube.    Follow up Palliative Care Visit: Palliative care will continue to follow for complex medical decision making, advance care planning, and clarification of goals. Return 6 weeks or prn.    This visit was coded based on medical decision making (MDM).  PPS: 60%  HOSPICE ELIGIBILITY/DIAGNOSIS: TBD  Chief Complaint:  Palliative Care is following for  chronic medical management in setting of Dementia.  HISTORY OF PRESENT ILLNESS:  Kathleen [name] is a 88 y.o. year old female with dementia, HTN, systolic CHF, atrial fibrillation on anticoagulation and severe protein calorie malnutrition.  Pt's daughter stated that since her mother can not give any significant hx but she has been getting up early again and had some minimal weight gain so she gave the prn Lasix for a couple of days and symptoms resolved. Pt denies CP, SOB, orthopnea, PND.  She endorses some DOE.  Denies falls, nausea, vomiting, bright red or dark tarry stools. On arrival pt is standing up washing her breakfast dishes. She is very pleasantly confused.  History obtained from review of EMR, discussion with daughter and/or Kathleen [name].  I reviewed EMR and no available labs, medication changes, imaging, studies and related documents since last visit.    ROS General: NAD ENMT: denies dysphagia Cardiovascular: denies chest pain, endorses DOE Pulmonary: denies cough, endorses slight increased SOB Abdomen: endorses good appetite, denies constipation, endorses continence of bowel GU: denies dysuria, endorses continence of urine MSK:  denies increased weakness, no falls reported Skin: denies rashes or wounds Neurological: denies pain, denies insomnia Psych: Endorses positive mood Heme/lymph/immuno: denies bruises, abnormal bleeding  Physical Exam: Current and past weights: 96.2 lbs on home scale Constitutional: NAD General: frail appearing, thin CV: S1S2, IRIR, no LE edema Pulmonary: Fine crackles bilateral bases, LUL exp wheeze, no increased work of breathing, no cough, room air Abdomen: normo-active BS + 4 quadrants, soft and non tender,  no ascites GU: deferred MSK: no sarcopenia, moves all extremities, ambulatory Skin: warm and dry, no rashes or wounds on visible skin Neuro:  no generalized weakness,  noted short term memory deficits Psych: non-anxious affect, Pleasantly confused  A & O x2 Hem/lymph/immuno: no widespread bruising   Thank you for the opportunity to participate in the care of Kathleen [name].  The palliative care team will continue to follow. Please call our office at [phone] if we can be of additional assistance.   Kathleen Conception, FNP -C  COVID-19 PATIENT SCREENING TOOL Asked and negative response unless otherwise noted:   Have you had symptoms of covid, tested positive or been in contact with someone with symptoms/positive test in the past 5-10 days?  No

## 2021-10-22 ENCOUNTER — Encounter: Payer: Self-pay | Admitting: Family Medicine

## 2021-11-19 DIAGNOSIS — M8000XD Age-related osteoporosis with current pathological fracture, unspecified site, subsequent encounter for fracture with routine healing: Secondary | ICD-10-CM | POA: Diagnosis not present

## 2021-11-19 DIAGNOSIS — Z1389 Encounter for screening for other disorder: Secondary | ICD-10-CM | POA: Diagnosis not present

## 2021-11-19 DIAGNOSIS — I509 Heart failure, unspecified: Secondary | ICD-10-CM | POA: Diagnosis not present

## 2021-11-19 DIAGNOSIS — R4189 Other symptoms and signs involving cognitive functions and awareness: Secondary | ICD-10-CM | POA: Diagnosis not present

## 2021-11-19 DIAGNOSIS — I700 Atherosclerosis of aorta: Secondary | ICD-10-CM | POA: Diagnosis not present

## 2021-11-19 DIAGNOSIS — Z0000 Encounter for general adult medical examination without abnormal findings: Secondary | ICD-10-CM | POA: Diagnosis not present

## 2021-11-19 DIAGNOSIS — Z23 Encounter for immunization: Secondary | ICD-10-CM | POA: Diagnosis not present

## 2021-11-22 ENCOUNTER — Other Ambulatory Visit: Payer: Medicare Other | Admitting: Family Medicine

## 2021-11-22 DIAGNOSIS — M546 Pain in thoracic spine: Secondary | ICD-10-CM

## 2021-11-22 DIAGNOSIS — R1310 Dysphagia, unspecified: Secondary | ICD-10-CM

## 2021-11-22 DIAGNOSIS — E43 Unspecified severe protein-calorie malnutrition: Secondary | ICD-10-CM | POA: Diagnosis not present

## 2021-11-22 NOTE — Progress Notes (Signed)
[facility] Consult Note Telephone: [phone]  Fax: [phone]    Date of encounter: 11/22/21 5:28 PM PATIENT NAME: Kathleen [name] [address] [place] [address]   [phone] (home)  DOB: June 19, 1982 MRN: [id number] PRIMARY CARE PROVIDER:    Lenetta [name]  [place] [address] [phone]  REFERRING PROVIDER:   Marda Stalker, [name],  [place] [address] [phone]  RESPONSIBLE PARTY:    Contact Information     Name Relation Home Work [place], april Daughter   [phone]   Kela Millin Daughter   [phone]      I met face to face with patient's daughter, patient was in bed sleeping during the visit in their home. Palliative Care was asked to follow this patient by consultation request of  Kathleen [name] to address advance care planning and complex medical decision making. This is a follow up visit  ASSESSMENT , SYMPTOM MANAGEMENT AND PLAN / RECOMMENDATIONS:  Acute thoracic back pain PCP has ordered thoracolumbar xray to check for compression fracture. Discussed with daughter possibility of adding chest and rib xray since pt has significant pain with movement. Getting relief with Tylenol.  Encouraged scheduled Tylenol 1000 MG TID x 7-14 days. Ice 10-15 minutes to point of maximal pain TID Advised if pt has compression fracture she may be a candidate for MiaCalcin nasal spray which will help with fracture healing and pain.  Protein Calorie Malnutrition Encouraged use of Boost/Ensure to make a pudding or milkshake to increase smaller volume caloric intake and prevent further weight loss. Encouraged use of smoothies and addition of protein powder. Advised to increase Ensure/Boost supplement to TID if not eating so that she can maintain hydration. Encouraged daughter to lightly freeze supplements in small containers where it will become  more slushy and the patient can do small frequent volumes.  3.  Problem swallowing Tylenol even broken in half too large for pt to comfortably swallow. Advised either crushing and putting in applesauce or ice cream to aid swallow or use of Tylenol (acetaminophen) liquid    Advance Care Planning/Goals of Care: Goals include to maximize quality of life and symptom management.  CODE STATUS: MOST AS OF  11/21/84: DNR/DNI with comfort measures Use of antibiotics and IV fluids on a case by case, time limited basis No feeding tube.    Follow up Palliative Care Visit: Palliative care will continue to follow for complex medical decision making, advance care planning, and clarification of goals. Return 6 weeks or prn.    This visit was coded based on medical decision making (MDM).  PPS: 60%  HOSPICE ELIGIBILITY/DIAGNOSIS: TBD  Chief Complaint:  Palliative Care is following for chronic medical management in setting of dementia with sudden increase in pain.  HISTORY OF PRESENT ILLNESS:  Kathleen [name] is a 88 y.o. year old female with dementia, HTN, systolic CHF, atrial fibrillation on anticoagulation and severe protein calorie malnutrition. Pt's daughter stated that she noted a laceration on her leg but her mother couldn't tell her what happened.  She did not hear her fall but pt has begun suddenly c/o severe pain in her back, particularly with movement and getting up and down.  She had seen PCP who was planning a thoracolumbar xray with [name] but daughter has not heard from them to schedule.  Daughter states she has been breaking Tylenol ES in half which does seem to help patient's pain.  She states pt has been sleeping longer and has not been awake to eat and drink very well but states when the patient finally got up at 2 pm she c/o being hungry.  She has supplements but will take 1 or 2 sips then will not drink more.  She states patient has been a little more confused.  Pt is currently  in bed sleeping.  History obtained from review of EMR, discussion with daughter for Kathleen [name].  I reviewed EMR and no available labs, medication changes, imaging, studies and related documents since last visit.    ROS Information as per above obtained from daughter  Physical Exam: Constitutional: sleeping   Thank you for the opportunity to participate in the care of Kathleen [name].  The palliative care team will continue to follow. Please call our office at [phone] if we can be of additional assistance.   Kathleen Conception, FNP -C  COVID-19 PATIENT SCREENING TOOL Asked and negative response unless otherwise noted:   Have you had symptoms of covid, tested positive or been in contact with someone with symptoms/positive test in the past 5-10 days?  No

## 2021-11-26 ENCOUNTER — Other Ambulatory Visit: Payer: Self-pay | Admitting: Family Medicine

## 2021-11-26 ENCOUNTER — Encounter: Payer: Self-pay | Admitting: Family Medicine

## 2021-11-26 ENCOUNTER — Ambulatory Visit
Admission: RE | Admit: 2021-11-26 | Discharge: 2021-11-26 | Disposition: A | Discharge status: Home / Self Care | Payer: Medicare Other | Source: Ambulatory Visit | Attending: Family Medicine | Admitting: Family Medicine

## 2021-11-26 DIAGNOSIS — S22080A Wedge compression fracture of T11-T12 vertebra, initial encounter for closed fracture: Secondary | ICD-10-CM | POA: Diagnosis not present

## 2021-11-26 DIAGNOSIS — S22060A Wedge compression fracture of T7-T8 vertebra, initial encounter for closed fracture: Secondary | ICD-10-CM | POA: Diagnosis not present

## 2021-11-26 DIAGNOSIS — M545 Low back pain, unspecified: Secondary | ICD-10-CM

## 2021-11-26 DIAGNOSIS — M546 Pain in thoracic spine: Secondary | ICD-10-CM | POA: Insufficient documentation

## 2021-11-26 DIAGNOSIS — R1310 Dysphagia, unspecified: Secondary | ICD-10-CM | POA: Insufficient documentation

## 2021-12-12 DIAGNOSIS — M8088XA Other osteoporosis with current pathological fracture, vertebra(e), initial encounter for fracture: Secondary | ICD-10-CM | POA: Diagnosis not present

## 2021-12-12 DIAGNOSIS — M546 Pain in thoracic spine: Secondary | ICD-10-CM | POA: Diagnosis not present

## 2021-12-20 ENCOUNTER — Other Ambulatory Visit: Payer: Medicare Other | Admitting: Family Medicine

## 2021-12-20 VITALS — BP 112/62 | HR 74 | Resp 18

## 2021-12-20 DIAGNOSIS — R5383 Other fatigue: Secondary | ICD-10-CM | POA: Diagnosis not present

## 2021-12-20 DIAGNOSIS — E43 Unspecified severe protein-calorie malnutrition: Secondary | ICD-10-CM | POA: Diagnosis not present

## 2021-12-20 DIAGNOSIS — M546 Pain in thoracic spine: Secondary | ICD-10-CM

## 2021-12-20 DIAGNOSIS — S22060D Wedge compression fracture of T7-T8 vertebra, subsequent encounter for fracture with routine healing: Secondary | ICD-10-CM

## 2021-12-20 DIAGNOSIS — R1310 Dysphagia, unspecified: Secondary | ICD-10-CM | POA: Diagnosis not present

## 2021-12-21 ENCOUNTER — Encounter: Payer: Self-pay | Admitting: Family Medicine

## 2021-12-21 DIAGNOSIS — S22000D Wedge compression fracture of unspecified thoracic vertebra, subsequent encounter for fracture with routine healing: Secondary | ICD-10-CM | POA: Insufficient documentation

## 2021-12-21 DIAGNOSIS — R5383 Other fatigue: Secondary | ICD-10-CM | POA: Insufficient documentation

## 2021-12-21 NOTE — Progress Notes (Signed)
[facility] Consult Note Telephone: [phone]  Fax: [phone]    Date of encounter: 12/20/2021 4:20 PM  PATIENT NAME: Kathleen [name] [address] [place] [address]   [phone] (home)  DOB: Aug 16, 1929 MRN: [id number] PRIMARY CARE PROVIDER:    Lenetta Quaker  [place] [address] [phone]  REFERRING PROVIDER:   Marda Stalker, PA-C [place],  [place] [address] [phone]  RESPONSIBLE PARTY:    Contact Information     Name Relation Home Work [place], april Daughter   [phone]   Kathleen [name] Daughter   [phone]      I met face to face with patient's daughter, patient was in bed sleeping during the visit in their home. Palliative Care was asked to follow this patient by consultation request of  Marda Stalker, PA-C to address advance care planning and complex medical decision making. This is a follow up visit  ASSESSMENT , SYMPTOM MANAGEMENT AND PLAN / RECOMMENDATIONS:  Acute thoracic back pain/compression fracture of T8 with routine healing Per daughter thoracolumbar xray per PCP was + for T8 vertebral compression fracture. Getting relief with Tylenol, now taking prn. Was evaluated by neurosurgery who recommended conservative management.  Protein Calorie Malnutrition Pt refusing most meals, will eat beans and likes them soupy. Not having much intake of protein supplement shakes or drinking well. Advised daughter to continue to offer but it is not uncommon as disease progresses for food/fluid intake to dwindle. Encouraged liberalizing diet even if it was for just sweets to get some calories in.  3.  Problem swallowing Resolved.  Pt is taking pain meds in applesauce or ice cream without difficulty  4. Caregiver fatigue Daughter April is living with pt, providing care and supervising daily routine for pt. Encouraged caregiver that her  concerns regarding pt's intake were normal and that hunger/thirst cues towards end of life were not as strong. Advised continued offering but knowledge that pt may decline to accept offer.  Reminded daughter that she was human too and try not to argue with pt or let it upset her.    Advance Care Planning/Goals of Care: Goals include to maximize quality of life and symptom management.  CODE STATUS: MOST AS OF  11/21/20: DNR/DNI with comfort measures Use of antibiotics and IV fluids on a case by case, time limited basis No feeding tube.    Follow up Palliative Care Visit: Palliative care will continue to follow for complex medical decision making, advance care planning, and clarification of goals. Return 4 weeks or prn.    This visit was coded based on medical decision making (MDM).  PPS: 40%  HOSPICE ELIGIBILITY/DIAGNOSIS: TBD  Chief Complaint:  Palliative Care is continuing to follow for chronic management in setting of dementia with recent acute compression fracture of T8 prior to last visit.   HISTORY OF PRESENT ILLNESS:  Kathleen [name] is a 88 y.o. year old female with dementia, HTN, systolic CHF, atrial fibrillation on anticoagulation and severe protein calorie malnutrition. Pt's daughter April lives with her and she has another daughter who lives out of town. April is the primary caregiver who cares for her mother.  She states that pt has been eating less and less, has lost some weight and is not really drinking the Ensure or much liquid.  She states that Tylenol given in applesauce or ice cream helped significantly for pt to get it down.  She states being seen  by a neurosurgeon who stated that pt could undergo a kyphoplasty but recommended just watchful waiting and did not recommend use of a brace.  April states that pt is sleeping more during the day and she noted a piece of uncovered metal on the foot of pt's bed that she hit her leg on.  She states pt still has a taste for sweets.   Daughter states she will eat primarily beans but they have to be soupy and with cornbread.  She states pt movements have been less restricted or painful appearing in the last 3 days and is only taking Tylenol prn.  She states pt has primarily been in the bed.  She states she has noted some overnight incontinent on occasion of urine which she has not had before.  Pt was seen sitting on the back porch and was very pleasant.  Denies pain, SOB, dysuria, falls, nausea, vomiting. Advised April of what current cognitive age pt was at, that decreasing nutrition and fluids was common towards end of life.  April verbalized frustration with herself for getting upset with pt when she was not wanting to eat or drink even though she knows pt will not remember it.  April has good support from her partner and sibling. Reminded her that she has been on target with her assessments of pt's needs real time and validated normalcy of her feelings and the stage of disease burden that her mother is carrying thus far.   History obtained from review of EMR, discussion with daughter for Ms. Ciocca.  I reviewed EMR and no available labs, medication changes, imaging, studies and related documents are available since last visit on Epic to review.    ROS General: NAD ENMT: endorses some dysphagia Cardiovascular: denies chest pain, DOE Pulmonary: denies cough, denies increased SOB Abdomen: endorses poor appetite, and fluid intake with mild weight loss per daughter, denies constipation, endorses continence of bowel GU: denies dysuria, endorses intermittent incontinence of urine MSK:  denies increased weakness, no recent falls reported, back pain lessened and responsive to Tylenol Skin: wound to right anterior shin healing Neurological: denies pain, denies insomnia Psych: Endorses positive mood Heme/lymph/immuno: denies bruises, abnormal bleeding   Physical Exam: Current and past weights: 92.8 lbs on home scale today, weight  07/06/21 94 lbs 3.2 ounces at Cardiologist office, 100 lbs on 01/12/21 Constitutional: NAD General: frail appearing, thin CV: S1S2, IRIR, no LE edema Pulmonary: CTAB, no increased work of breathing, no cough, room air Abdomen: normo-active BS + 4 quadrants, soft and non tender MSK: sarcopenia, moves all extremities, ambulatory with assistive device Skin: warm and dry, dried/scabbed healing wound to anterior right shin Neuro:  no generalized weakness, noted short term memory deficits Psych: non-anxious affect, Pleasantly confused A & O x2 Hem/lymph/immuno: no widespread bruising    Thank you for the opportunity to participate in the care of Ms. Salvato.  The palliative care team will continue to follow. Please call our office at [phone] if we can be of additional assistance.   Marijo Conception, FNP -C  COVID-19 PATIENT SCREENING TOOL Asked and negative response unless otherwise noted:   Have you had symptoms of covid, tested positive or been in contact with someone with symptoms/positive test in the past 5-10 days?  No

## 2021-12-31 ENCOUNTER — Other Ambulatory Visit: Payer: Self-pay | Admitting: Cardiology

## 2021-12-31 DIAGNOSIS — I4821 Permanent atrial fibrillation: Secondary | ICD-10-CM

## 2021-12-31 NOTE — Telephone Encounter (Signed)
Eliquis 2.5mg  refill request received. Patient is 88 years old, weight-42.5kg, Crea-0.96 on 07/06/2021, Diagnosis-Afib, and last seen by Dr. Percival Spanish on 07/06/2021. Dose is appropriate based on dosing criteria. Will send in refill to requested pharmacy.

## 2022-01-10 DIAGNOSIS — M8088XA Other osteoporosis with current pathological fracture, vertebra(e), initial encounter for fracture: Secondary | ICD-10-CM | POA: Diagnosis not present

## 2022-02-21 ENCOUNTER — Other Ambulatory Visit: Payer: Medicare Other | Admitting: Family Medicine

## 2022-02-21 ENCOUNTER — Encounter: Payer: Self-pay | Admitting: Family Medicine

## 2022-02-21 VITALS — BP 112/60 | HR 88 | Resp 18

## 2022-02-21 DIAGNOSIS — E43 Unspecified severe protein-calorie malnutrition: Secondary | ICD-10-CM

## 2022-02-21 DIAGNOSIS — M546 Pain in thoracic spine: Secondary | ICD-10-CM

## 2022-02-21 DIAGNOSIS — F0280 Dementia in other diseases classified elsewhere without behavioral disturbance: Secondary | ICD-10-CM

## 2022-02-21 DIAGNOSIS — J069 Acute upper respiratory infection, unspecified: Secondary | ICD-10-CM

## 2022-02-21 NOTE — Progress Notes (Signed)
[facility] Palliative Care Consult Note Telephone: [phone]  Fax: [phone]    Date of encounter: 12/20/2021 4:20 PM  PATIENT NAME: Kathleen [name] [address] [place] [address]   [phone] (home)  DOB: [birth year] MRN: [id number] PRIMARY CARE PROVIDER:    Lenetta [name]  [place] [address] [phone]  REFERRING PROVIDER:   Marda Stalker, PA-C [place],  [place] [address] [phone]  RESPONSIBLE PARTY:    Contact Information     Name Relation Home Work [place], Kathleen [name] Daughter   [phone]   Kela Millin Daughter   [phone]      I met face to face with patient and her daughter, Kathleen [name] in their home. Palliative Care was asked to follow this patient by consultation request of  Kathleen Stalker, PA-C to address advance care planning and complex medical decision making. This is a follow up visit  ASSESSMENT , SYMPTOM MANAGEMENT AND PLAN / RECOMMENDATIONS:  URI Resolving.  Continue to increase fluids, mucolytics. Up to date on Covid vaccines. Encouraged caregiver to have pt brush her teeth first thing in the am to decrease bacterial load in mouth so if she aspirates there is less bacterial contaminant  Dementia Fast 7 score 6 B Minimal issues with swallowing since daughter crushing, using liquid meds and placing in applesauce or ice cream. Advised daughter to look for medications that are available in liquid or have capsules that can be opened.  Advised if extended release medications are prescribed that they cannot be crushed but if scored can be broken at the score line. Advised pt is very functional and stable, will look for community program to help with chronic management and support. Advised daughter if pt has significant decline in function, weight loss or worsening due to other comorbidities to notify [facility] Palliative program.  3.    Protein Calorie Malnutrition Improving intake, recent decline due to URI Increasing calories with use of foods like ice cream and applesauce for meds.  4.   Acute thoracic back pain/compression fracture of T8 with routine healing Resolved, no chronic pain currently.     Advance Care Planning/Goals of Care: Goals include to maximize quality of life and symptom management.  CODE STATUS: MOST AS OF  11/21/20: DNR/DNI with comfort measures Use of antibiotics and IV fluids on a case by case, time limited basis No feeding tube.    Follow up Palliative Care Visit: Palliative care will defer to chronic disease management care.   This visit was coded based on medical decision making (MDM).  PPS: 60%  HOSPICE ELIGIBILITY/DIAGNOSIS: TBD  Chief Complaint:  Palliative Care is continuing to follow patient for chronic medical management in setting of dementia with recent URI.   HISTORY OF PRESENT ILLNESS:  Kathleen [name] is a 88 y.o. year old female with dementia, HTN, systolic CHF, atrial fibrillation on anticoagulation and severe protein calorie malnutrition. Pt's daughter Kathleen [name] lives with her and she has another daughter who lives out of town. Kathleen [name] is the primary caregiver who cares for her mother.  They recently had a family member who visited having a severe cold and within 2 days pt was symptomatic with same.  She has had productive cough, lethargy, poor appetite and some wheezing but has significantly improved over prior 24-36 hours.  Wounds on legs have healed.  She had not bathed in 2 weeks until today.  She has had a severe cold and no fever.  Denies falls.  Had not been drinking and daughter has been trying to get Ensure in daily.  States weight is about 1 lb off. Has productive cough but doesn't think it is discolored and symptoms removed.   Denies pain, SOB, dysuria, falls, nausea, vomiting.    History obtained from review of EMR, discussion with daughter for Kathleen [name].  I reviewed  EMR and no available labs, medication changes, imaging, studies and related documents are available since last visit on Epic to review.    ROS General: NAD ENMT: endorses rare dysphagia Cardiovascular: denies chest pain, DOE Pulmonary: endorses cough with decreasing production of phlegm, denies increased SOB Abdomen: endorses poor appetite, now improving and poor fluid intake with mild weight loss per daughter, denies constipation, endorses continence of bowel GU: denies dysuria, endorses intermittent incontinence of urine MSK:  denies increased weakness, no recent falls reported, no back pain Skin: prior LE abrasions have completely healed Neurological: denies pain, denies insomnia Psych: Endorses positive mood Heme/lymph/immuno: denies bruises, abnormal bleeding   Physical Exam: Current and past weights: 96.0 lbs on 02/20/22, 100 lbs on 01/12/21 Constitutional: NAD General: frail, thin CV: S1S2, IRIR, no LE edema Pulmonary: Upper airway inspiratory wheeze intermittently, no increased work of breathing, wet cough, room air Abdomen: normo-active BS + 4 quadrants, soft and non tender MSK: sarcopenia, moves all extremities, ambulatory with assistive device increased thoracic kyphosis Skin: warm and dry Neuro:  no generalized weakness, noted short term memory deficits Psych: non-anxious affect, Pleasantly confused A & O x2 Hem/lymph/immuno: no widespread bruising    Thank you for the opportunity to participate in the care of Kathleen [name].  The palliative care team will continue to follow. Please call our office at [phone] if we can be of additional assistance.   Marijo Conception, FNP -C  COVID-19 PATIENT SCREENING TOOL Asked and negative response unless otherwise noted:   Have you had symptoms of covid, tested positive or been in contact with someone with symptoms/positive test in the past 5-10 days?  Symptomatic with cold sx, up to date on vaccines and improving

## 2022-02-23 ENCOUNTER — Encounter: Payer: Self-pay | Admitting: Family Medicine

## 2022-02-23 DIAGNOSIS — J069 Acute upper respiratory infection, unspecified: Secondary | ICD-10-CM | POA: Insufficient documentation

## 2022-03-26 ENCOUNTER — Ambulatory Visit: Payer: Medicare Other | Admitting: Neurology

## 2022-05-29 IMAGING — DX DG CHEST 2V
2 series · 2 of 2 positions shown · non-contrast
Comparison: None.

CLINICAL DATA: Shortness of breath

EXAM:
CHEST - 2 VIEW

[chest pa]
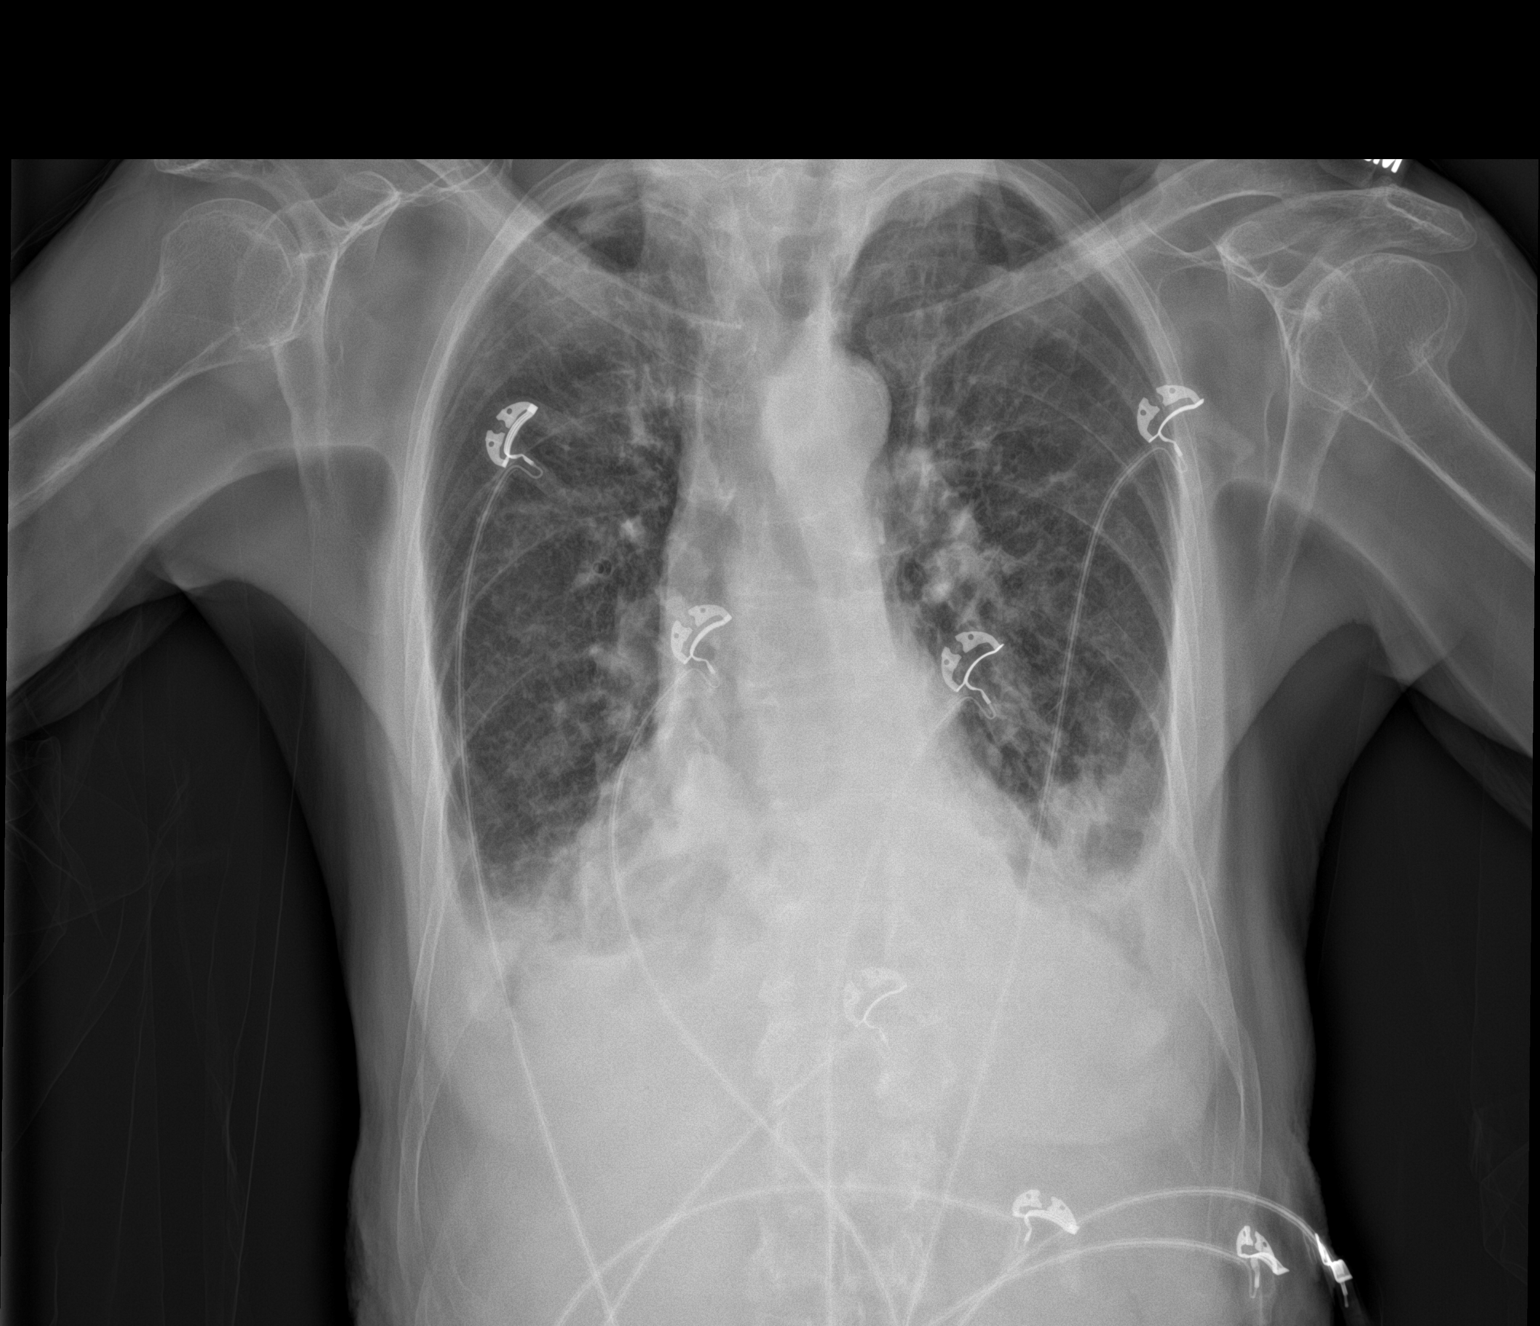

[chest lat]
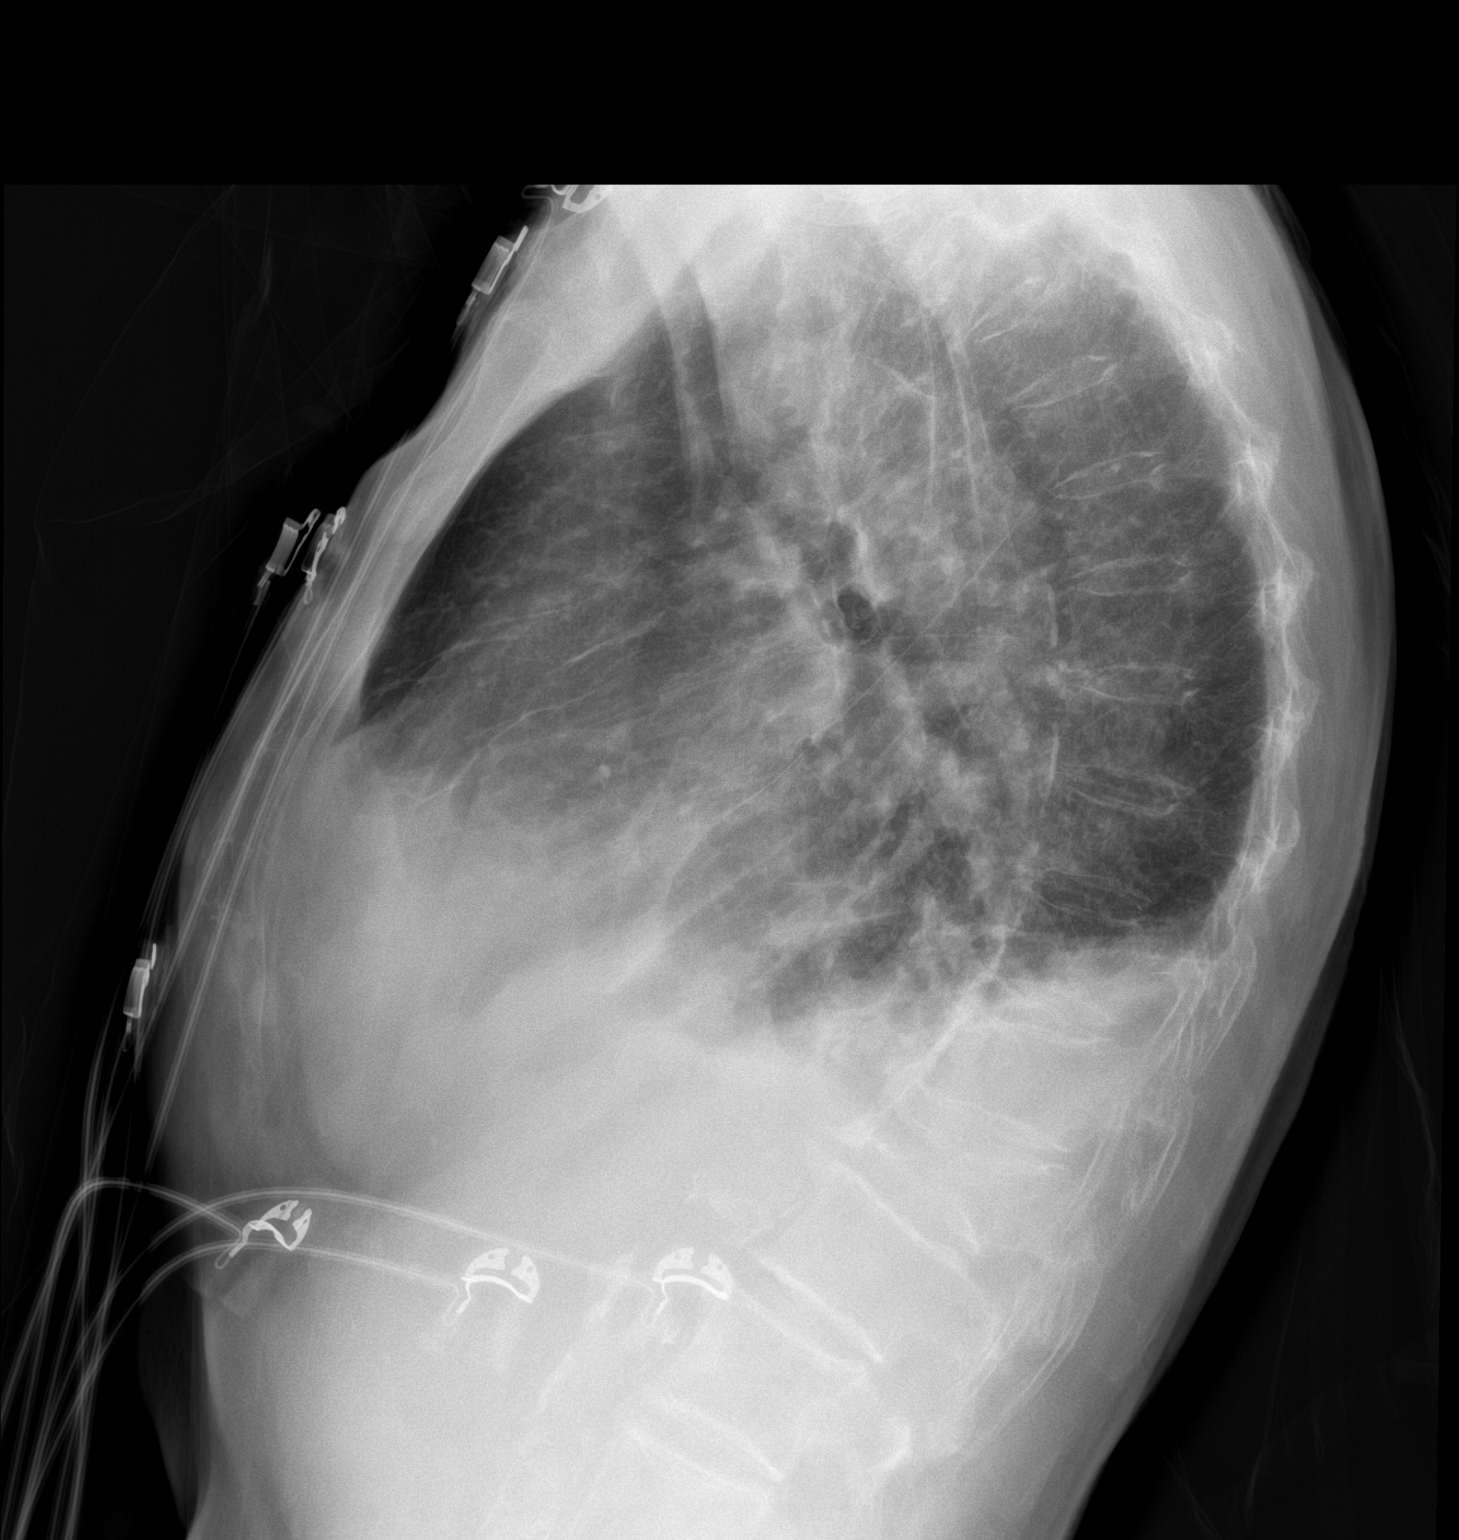

[2 of 2 positions shown; findings below may reference images not displayed]

FINDINGS: There are pleural effusions bilaterally with patchy airspace opacity
in the lower lung regions bilaterally. Heart size and pulmonary
vascularity are normal. No adenopathy. There is aortic
atherosclerosis. There is severe anterior wedging of the T12
vertebral body. There is an old healed fracture of the proximal left
humerus. Bones are osteoporotic.
IMPRESSION: Pleural effusions bilaterally. Suspect bibasilar pneumonia
superimposed. Heart size and pulmonary vascularity are normal.
Osteoporosis with prior fractures of the proximal left humerus and
T12 vertebral body.

Aortic Atherosclerosis ([id number]-056.6).

## 2022-06-03 DIAGNOSIS — I4891 Unspecified atrial fibrillation: Secondary | ICD-10-CM | POA: Diagnosis not present

## 2022-06-03 DIAGNOSIS — I10 Essential (primary) hypertension: Secondary | ICD-10-CM | POA: Diagnosis not present

## 2022-06-03 DIAGNOSIS — I509 Heart failure, unspecified: Secondary | ICD-10-CM | POA: Diagnosis not present

## 2022-06-29 ENCOUNTER — Other Ambulatory Visit: Payer: Self-pay | Admitting: Cardiology

## 2022-06-29 DIAGNOSIS — I4821 Permanent atrial fibrillation: Secondary | ICD-10-CM

## 2022-07-01 NOTE — Telephone Encounter (Signed)
Prescription refill request for Eliquis received. Indication: Last office visit: 07/06/21  Vita Barley MD  (Has appt 07/19/22) Scr: 0.96 on 07/06/21 Age: 89 Weight: 42.7kg   Based on above findings Eliquis 2.5mg  twice daily is the appropriate dose.  Refill approved.

## 2022-07-03 ENCOUNTER — Other Ambulatory Visit: Payer: Self-pay

## 2022-07-12 ENCOUNTER — Other Ambulatory Visit: Payer: Self-pay

## 2022-07-15 ENCOUNTER — Telehealth: Payer: Self-pay

## 2022-07-15 NOTE — Telephone Encounter (Signed)
(  4:46 pm) PC SW left a voice message for patient's daughter-April requesting a call back to discuss and schedule palliative care visit.

## 2022-07-18 DIAGNOSIS — I5022 Chronic systolic (congestive) heart failure: Secondary | ICD-10-CM | POA: Insufficient documentation

## 2022-07-18 DIAGNOSIS — I480 Paroxysmal atrial fibrillation: Secondary | ICD-10-CM | POA: Insufficient documentation

## 2022-07-18 NOTE — Progress Notes (Signed)
  Cardiology Office Note:   Date:  07/19/2022  ID:  Kathleen [name], DOB [birth year], MRN [id number]  History of Present Illness:   Kathleen [name] is a 89 y.o. female has a PMH of dementia, acute systolic CHF, and atrial fibrillation.   She was admitted with acute systolic CHF 09/2020.  She was noted to be fluid volume overloaded and was in atrial fibrillation.  She received diuresis and was started on Eliquis.  She did not tolerate Entresto and was treated with furosemide and metoprolol.  She presents for follow up.    Since I last saw her she has been doing okay.  She is getting more frail.  She has problems with dementia.  Her daughter is her caregiver.  She is at fall risk.  She did have episodes of her heart racing and not being herself but short time ago and her daughter gave her a couple doses of Lasix.  She improved.  She is not having any new chest pressure, neck or arm discomfort.  She is not having any new palpitations, presyncope or syncope.  She denies any chest pain.  ROS: As stated in the HPI and negative for all other systems.  Studies Reviewed:    EKG: Sinus rhythm, rate 79, premature ectopic contractions, no acute ST-T wave changes.   Risk Assessment/Calculations:    CHA2DS2-VASc Score = 4  This indicates a 4.8% annual risk of stroke. The patient's score is based upon: CHF History: 1 HTN History: 0 Diabetes History: 0 Stroke History: 0 Vascular Disease History: 0 Age Score: 2 Gender Score: 1   Physical Exam:   VS:  BP 110/82 (BP Location: Right Arm, Patient Position: Sitting, Cuff Size: Normal)   Pulse 79   Ht 5\' 1"  (1.549 m)   Wt 96 lb (43.5 kg)   SpO2 91%   BMI 18.14 kg/m    Wt Readings from Last 3 Encounters:  07/19/22 96 lb (43.5 kg)  10/18/21 93 lb 12.8 oz (42.5 kg)  09/18/21 96 lb (43.5 kg)     GEN: Slightly frail NECK: No JVD; No carotid bruits CARDIAC: RRR, no murmurs, rubs, gallops RESPIRATORY:  Clear to auscultation without rales, wheezing or rhonchi   ABDOMEN: Soft, non-tender, non-distended EXTREMITIES:  No edema; No deformity   ASSESSMENT AND PLAN:     Chronic systolic HF: She seems to be euvolemic.  No change in therapy.   Atrial fib with RVR:    Kathleen [name] has a CHA2DS2 - VASc score of 4.  She tolerates anticoagulation.  She has had no symptomatic paroxysms.  I will check labs today to include CBC and basic metabolic profile.   Other: I have ordered home health nursing because of her frailty and fall risk and dementia.    Signed, Rollene Rotunda, MD

## 2022-07-19 ENCOUNTER — Ambulatory Visit: Payer: Medicare Other | Attending: Cardiology | Admitting: Cardiology

## 2022-07-19 ENCOUNTER — Encounter: Payer: Self-pay | Admitting: Cardiology

## 2022-07-19 VITALS — BP 110/82 | HR 79 | Ht 61.0 in | Wt 96.0 lb

## 2022-07-19 DIAGNOSIS — I480 Paroxysmal atrial fibrillation: Secondary | ICD-10-CM | POA: Diagnosis not present

## 2022-07-19 DIAGNOSIS — I5022 Chronic systolic (congestive) heart failure: Secondary | ICD-10-CM | POA: Diagnosis not present

## 2022-07-19 NOTE — Patient Instructions (Addendum)
Medication Instructions:   No changes *If you need a refill on your cardiac medications before your next appointment, please call your pharmacy*   Lab Work: BMP CBC If you have labs (blood work) drawn today and your tests are completely normal, you will receive your results only by: MyChart Message (if you have MyChart) OR A paper copy in the mail If you have any lab test that is abnormal or we need to change your treatment, we will call you to review the results.   Testing/Procedures:  Not needed  Follow-Up: At [facility], you and your health needs are our priority.  As part of our continuing mission to provide you with exceptional heart care, we have created designated Provider Care Teams.  These Care Teams include your primary Cardiologist (physician) and Advanced Practice Providers (APPs -  Physician Assistants and Nurse Practitioners) who all work together to provide you with the care you need, when you need it.     Your next appointment:   12 month(s)  The format for your next appointment:   In Person  Provider:   Rollene Rotunda, MD    Other Instructions  You have been referred to  [facility]  for Palliative care- fall risk , dementia , fragility

## 2022-07-20 LAB — CBC
Hematocrit: 35.6 % (ref 34.0–46.6)
Hemoglobin: 11.7 g/dL (ref 11.1–15.9)
MCH: 33.1 pg — ABNORMAL HIGH (ref 26.6–33.0)
MCHC: 32.9 g/dL (ref 31.5–35.7)
MCV: 101 fL — ABNORMAL HIGH (ref 79–97)
Platelets: 192 10*3/uL (ref 150–450)
RBC: 3.53 x10E6/uL — ABNORMAL LOW (ref 3.77–5.28)
RDW: 13 % (ref 11.7–15.4)
WBC: 6 10*3/uL (ref 3.4–10.8)

## 2022-07-20 LAB — BASIC METABOLIC PANEL
BUN/Creatinine Ratio: 15 (ref 12–28)
BUN: 15 mg/dL (ref 10–36)
CO2: 26 mmol/L (ref 20–29)
Calcium: 9.5 mg/dL (ref 8.7–10.3)
Chloride: 103 mmol/L (ref 96–106)
Creatinine, Ser: 1 mg/dL (ref 0.57–1.00)
Glucose: 98 mg/dL (ref 70–99)
Potassium: 4.7 mmol/L (ref 3.5–5.2)
Sodium: 142 mmol/L (ref 134–144)
eGFR: 54 mL/min/{1.73_m2} — ABNORMAL LOW (ref >59)

## 2022-07-22 ENCOUNTER — Other Ambulatory Visit: Payer: Self-pay

## 2022-07-22 DIAGNOSIS — Z515 Encounter for palliative care: Secondary | ICD-10-CM

## 2022-08-05 ENCOUNTER — Other Ambulatory Visit: Payer: Self-pay

## 2022-08-05 VITALS — BP 116/52 | HR 80 | Temp 96.5°F

## 2022-08-05 DIAGNOSIS — Z515 Encounter for palliative care: Secondary | ICD-10-CM

## 2022-08-05 NOTE — Progress Notes (Signed)
COMMUNITY PALLIATIVE CARE SW NOTE  PATIENT NAME: Kathleen [name] DOB: [birth year] MRN: [id number]  PRIMARY CARE PROVIDER: Jarrett Soho, PA-C  RESPONSIBLE PARTY:  Acct ID - Guarantor Home Phone Work Phone Relationship Acct Type  [id number] [name], [name] [phone]  Self P/F     [address], [place], [place] [address]   Palliative Care Follow-up Visit/Clinical Social Work   Visit Summary:  Initial visit with patient at her home where she was present with her daughter-April. Patient greeted the team warmly and at the door.  Patient was an established patient before and was discharged due to no longer being appropriate for services. Verbal consent to services provided and patient and her daughter provided a status update.  The daughter has purchased an automatic blood pressure cuff to monitor patient's blood pressure. Her blood pressures have been consistent. She also weighs patient weekly. Patient denies pain.  Patient has increased episode of incontinent bowel and bladder issues. Patient refuses to wear Depends or pad inserts. Patient has some SOB and issues where she struggles to breathe. But these episodes are intermittent. Patient ambulates independently in and out of the home. She will hold on to her daughter for safety when going up and down stairs. She needs assistance with personal care tasks which includes moderate assistance with showers and a lot of cueing.  She eats three meals tiny meals with a snack.  She also drinks an Ensure complete. Last weights was 93.6 lbs. taken by the daughter. Patient sleeps well at night, but also naps during the day.  Education and support provided to patient's daughter education on dementia, providing interventions that may decrease her frustrations, while normalizing her experiences and changes noted in patient.   No other concerns were noted.   Goal of care: Is for patient to remain safely at home.      Social History   Tobacco Use    Smoking status: Never   Smokeless tobacco: Never  Substance Use Topics   Alcohol use: Never   *Cardiac Handbook: Yes, provided *Dementia Handbook: Yes, provided  CODE STATUS: DNR ADVANCED DIRECTIVES: Yes MOST FORM COMPLETE:  Yes HOSPICE EDUCATION PROVIDED: No PPS: 75%  Next Scheduled appointment: Patient will be contacted every 4/6 weeks via telephonic check-in by clinical staff;  however, daughter advised to call with any changes in condition or concerns.  [address] [place], [place]

## 2022-08-05 NOTE — Progress Notes (Signed)
PATIENT NAME: Kathleen [name] DOB: October 03, 1929 MRN: [id number]  PRIMARY CARE PROVIDER: Jarrett Soho, PA-C  RESPONSIBLE PARTY:  Acct ID - Guarantor Home Phone Work Phone Relationship Acct Type  [id number] Kathleen [name] [phone]  Self P/F     [address], [place], [place] [address]    Palliative Care Encounter Note   Completed home visit with Kathleen [name], SW. Daughter Kathleen [name] also present.     HISTORY OF PRESENT ILLNESS:  Alzheimer's, CHF, HTN  TODAY'S VISIT:  Respiratory: SOB when there is fluid overload  Cardiac: CHF; no edema noted  Cognitive: Alzheimer's; memory loss; Dementia booklet given   Appetite: 3 reduced meals and snacks; drinks a portion of an Ensure daily; fluid intake may be 36oz per day (on a good day)  GI/GU: has a regular BM but can't tell how regular; has had episode of incontinence    Mobility: independent    ADLs:  independent with teeth brushing but needs cues and mod assist for all others   Sleeping Pattern: sleeps 8+ hrs per night; does not awaken for toileting  Pain: denies pain  Wt: last wt at home was 93 lbs  Palliative Care/ Hospice: LPN explained role and purpose of palliative care including visit frequency. Also discussed benefits of hospice care as well as the differences between the two with patient.   Goals of Care: To stay in the home with her daughter    Next Appt Scheduled For: pt to be moved to the volunteer list    CODE STATUS: DNR ADVANCED DIRECTIVES: N MOST FORM: Y PPS: 80%   PHYSICAL EXAM:   VITALS: Today's Vitals   08/05/22 1210  BP: (!) 116/52  Pulse: 80  Temp: (!) 96.5 F (35.8 C)  TempSrc: Temporal  SpO2: 96%  PainSc: 0-No pain    LUNGS: clear to auscultation  CARDIAC: Cor irreg, irreg RRR EXTREMITIES: AROM x4; no edema SKIN: Skin color, texture, turgor normal. No rashes or lesions or mobility and turgor normal and no edema  NEURO: negative except for memory problems and  weakness       Kathleen Tom Clementeen Graham, LPN

## 2022-08-19 NOTE — Progress Notes (Signed)
COMMUNITY PALLIATIVE CARE SW NOTE  PATIENT NAME: Kathleen [name] DOB: 26-May-1929 MRN: [id number]  PRIMARY CARE PROVIDER: Jarrett Soho, PA-C  RESPONSIBLE PARTY:  Acct ID - Guarantor Home Phone Work Phone Relationship Acct Type  [id number] [name], [name] [phone]  Self P/F     [address], [place], [place] [address]   Initial Palliative Care Encounter/Clinical Social Work   [employer] SW C completed an initial telephonic encounter with patient's daughter-April. SW reiterated program criteria to patient's daughter.She verbalized understanding and an initial verbal consent. She advised that patient attends a half day memory care program through [employer] (Tues. Thurs. & Mon. ). Her appetite is fair and she has increased fatigue. Her most recent blood work shows anemia. April is open to a follow-up visit with the nurse. SW scheduled an appointment for 08/05/22 @ 12 pm.    Social History   Tobacco Use   Smoking status: Never   Smokeless tobacco: Never  Substance Use Topics   Alcohol use: Never    CODE STATUS: DNR ADVANCED DIRECTIVES: No MOST FORM COMPLETE:  Yes HOSPICE EDUCATION PROVIDED: No  Duration of encounter and documentation: 30 minutes  [employer], LCSW

## 2022-09-25 ENCOUNTER — Other Ambulatory Visit: Payer: Self-pay | Admitting: Cardiology

## 2022-12-31 ENCOUNTER — Other Ambulatory Visit: Payer: Self-pay | Admitting: Cardiology

## 2022-12-31 DIAGNOSIS — I4821 Permanent atrial fibrillation: Secondary | ICD-10-CM

## 2022-12-31 NOTE — Telephone Encounter (Signed)
Prescription refill request for Eliquis received. Indication:afib Last office visit:4/24 Scr:1.00  4/24 Age: 89 Weight:43.5  kg  Prescription refilled

## 2023-03-27 DIAGNOSIS — I700 Atherosclerosis of aorta: Secondary | ICD-10-CM | POA: Diagnosis not present

## 2023-03-27 DIAGNOSIS — I4891 Unspecified atrial fibrillation: Secondary | ICD-10-CM | POA: Diagnosis not present

## 2023-03-27 DIAGNOSIS — D6869 Other thrombophilia: Secondary | ICD-10-CM | POA: Diagnosis not present

## 2023-03-27 DIAGNOSIS — F0280 Dementia in other diseases classified elsewhere without behavioral disturbance: Secondary | ICD-10-CM | POA: Diagnosis not present

## 2023-05-10 DEATH — deceased
